# Patient Record
Sex: Female | Born: 1975 | Race: White | Hispanic: No | Marital: Married | Smoking: Former smoker
Health system: Southern US, Community
[De-identification: ages and names within clinical notes are randomized; demographics above are authoritative.]

## PROBLEM LIST (undated history)

## (undated) DIAGNOSIS — E119 Type 2 diabetes mellitus without complications: Secondary | ICD-10-CM

## (undated) DIAGNOSIS — I10 Essential (primary) hypertension: Secondary | ICD-10-CM

## (undated) HISTORY — PX: TUBAL LIGATION: SHX77

## (undated) HISTORY — DX: Type 2 diabetes mellitus without complications: E11.9

## (undated) HISTORY — DX: Essential (primary) hypertension: I10

---

## 2005-03-03 ENCOUNTER — Ambulatory Visit: Payer: Self-pay

## 2006-05-31 ENCOUNTER — Emergency Department: Payer: Self-pay | Admitting: Emergency Medicine

## 2007-08-05 ENCOUNTER — Emergency Department: Payer: Self-pay | Admitting: Emergency Medicine

## 2010-03-19 ENCOUNTER — Emergency Department: Payer: Self-pay

## 2010-08-29 ENCOUNTER — Emergency Department: Payer: Self-pay | Admitting: Unknown Physician Specialty

## 2010-12-07 ENCOUNTER — Ambulatory Visit: Payer: Self-pay | Admitting: Internal Medicine

## 2011-02-08 ENCOUNTER — Ambulatory Visit: Payer: Self-pay | Admitting: Surgery

## 2011-02-10 LAB — PATHOLOGY REPORT

## 2012-03-21 ENCOUNTER — Ambulatory Visit: Payer: Self-pay

## 2012-10-26 ENCOUNTER — Ambulatory Visit: Payer: Self-pay | Admitting: Internal Medicine

## 2012-12-27 ENCOUNTER — Other Ambulatory Visit: Payer: Self-pay | Admitting: Family

## 2013-01-18 ENCOUNTER — Ambulatory Visit: Payer: Self-pay

## 2013-01-23 ENCOUNTER — Ambulatory Visit: Payer: Self-pay

## 2013-02-26 ENCOUNTER — Ambulatory Visit: Payer: Self-pay

## 2013-09-06 ENCOUNTER — Emergency Department: Payer: Self-pay | Admitting: Emergency Medicine

## 2013-09-06 LAB — CBC WITH DIFFERENTIAL/PLATELET
Basophil #: 0.1 10*3/uL (ref 0.0–0.1)
Basophil %: 0.5 %
Eosinophil #: 0.2 10*3/uL (ref 0.0–0.7)
Eosinophil %: 1.5 %
HCT: 42.7 % (ref 35.0–47.0)
HGB: 14.5 g/dL (ref 12.0–16.0)
Lymphocyte #: 2.9 10*3/uL (ref 1.0–3.6)
MCH: 29.5 pg (ref 26.0–34.0)
MCV: 87 fL (ref 80–100)
Monocyte #: 0.8 x10 3/mm (ref 0.2–0.9)
Monocyte %: 5.6 %
Neutrophil #: 9.8 10*3/uL — ABNORMAL HIGH (ref 1.4–6.5)
Neutrophil %: 71.5 %
RBC: 4.94 10*6/uL (ref 3.80–5.20)
RDW: 12.9 % (ref 11.5–14.5)

## 2013-09-06 LAB — BASIC METABOLIC PANEL
BUN: 10 mg/dL (ref 7–18)
Calcium, Total: 9.1 mg/dL (ref 8.5–10.1)
Creatinine: 0.75 mg/dL (ref 0.60–1.30)
EGFR (Non-African Amer.): >60
Glucose: 261 mg/dL — ABNORMAL HIGH (ref 65–99)
Osmolality: 274 (ref 275–301)
Potassium: 3.3 mmol/L — ABNORMAL LOW (ref 3.5–5.1)
Sodium: 133 mmol/L — ABNORMAL LOW (ref 136–145)

## 2013-09-06 LAB — DRUG SCREEN, URINE

## 2013-09-06 LAB — URINALYSIS, COMPLETE
Bilirubin,UR: NEGATIVE
Blood: NEGATIVE
Glucose,UR: >=500 mg/dL (ref 0–75)
Leukocyte Esterase: NEGATIVE
Nitrite: NEGATIVE
Specific Gravity: 1.011 (ref 1.003–1.030)

## 2014-03-20 ENCOUNTER — Emergency Department: Payer: Self-pay | Admitting: Emergency Medicine

## 2014-03-20 LAB — HEMOGLOBIN: HGB: 13 g/dL (ref 12.0–16.0)

## 2014-03-21 ENCOUNTER — Emergency Department: Payer: Self-pay | Admitting: Cytopathology

## 2015-06-16 ENCOUNTER — Other Ambulatory Visit
Admission: RE | Admit: 2015-06-16 | Discharge: 2015-06-16 | Disposition: A | Discharge status: Home / Self Care | Payer: Self-pay | Source: Ambulatory Visit | Attending: Nurse Practitioner | Admitting: Nurse Practitioner

## 2015-06-16 DIAGNOSIS — E119 Type 2 diabetes mellitus without complications: Secondary | ICD-10-CM | POA: Insufficient documentation

## 2015-06-16 DIAGNOSIS — I10 Essential (primary) hypertension: Secondary | ICD-10-CM | POA: Insufficient documentation

## 2015-06-16 LAB — COMPREHENSIVE METABOLIC PANEL
ALT: 23 U/L (ref 14–54)
AST: 20 U/L (ref 15–41)
Albumin: 4.3 g/dL (ref 3.5–5.0)
Alkaline Phosphatase: 55 U/L (ref 38–126)
Anion gap: 7 (ref 5–15)
BUN: 10 mg/dL (ref 6–20)
CHLORIDE: 105 mmol/L (ref 101–111)
CO2: 25 mmol/L (ref 22–32)
CREATININE: 0.53 mg/dL (ref 0.44–1.00)
Calcium: 8.8 mg/dL — ABNORMAL LOW (ref 8.9–10.3)
GFR calc non Af Amer: >60 mL/min (ref >60)
Glucose, Bld: 142 mg/dL — ABNORMAL HIGH (ref 65–99)
POTASSIUM: 3.7 mmol/L (ref 3.5–5.1)
SODIUM: 137 mmol/L (ref 135–145)
Total Bilirubin: 0.1 mg/dL — ABNORMAL LOW (ref 0.3–1.2)
Total Protein: 7.2 g/dL (ref 6.5–8.1)

## 2015-06-16 LAB — T4, FREE: Free T4: 0.72 ng/dL (ref 0.61–1.12)

## 2015-06-16 LAB — LIPID PANEL
CHOL/HDL RATIO: 6 ratio
Cholesterol: 198 mg/dL (ref 0–200)
HDL: 33 mg/dL — AB (ref >40)
LDL CALC: 86 mg/dL (ref 0–99)
TRIGLYCERIDES: 396 mg/dL — AB (ref <150)
VLDL: 79 mg/dL — AB (ref 0–40)

## 2015-06-16 LAB — TSH: TSH: 0.967 u[IU]/mL (ref 0.350–4.500)

## 2017-11-24 ENCOUNTER — Ambulatory Visit: Payer: Self-pay | Admitting: Nurse Practitioner

## 2017-12-22 NOTE — Progress Notes (Signed)
Faxed patient assitance reorder form to [employer] for [employer] [phone]/[place]

## 2017-12-23 ENCOUNTER — Ambulatory Visit: Payer: Self-pay | Admitting: Nurse Practitioner

## 2017-12-23 ENCOUNTER — Other Ambulatory Visit: Payer: Self-pay

## 2017-12-23 MED ORDER — GLIMEPIRIDE 4 MG PO TABS: 4.0000 mg | ORAL_TABLET | Freq: Two times a day (BID) | ORAL | 5 refills | Status: DC

## 2017-12-28 ENCOUNTER — Encounter: Payer: Self-pay | Admitting: Internal Medicine

## 2017-12-28 ENCOUNTER — Ambulatory Visit: Payer: Self-pay | Admitting: Internal Medicine

## 2017-12-28 VITALS — BP 172/96 | HR 79 | Resp 16 | Ht 67.0 in | Wt 238.8 lb

## 2017-12-28 DIAGNOSIS — E119 Type 2 diabetes mellitus without complications: Secondary | ICD-10-CM | POA: Insufficient documentation

## 2017-12-28 DIAGNOSIS — E1165 Type 2 diabetes mellitus with hyperglycemia: Secondary | ICD-10-CM

## 2017-12-28 DIAGNOSIS — I10 Essential (primary) hypertension: Secondary | ICD-10-CM

## 2017-12-28 DIAGNOSIS — Z9119 Patient's noncompliance with other medical treatment and regimen: Secondary | ICD-10-CM

## 2017-12-28 DIAGNOSIS — Z91199 Patient's noncompliance with other medical treatment and regimen due to unspecified reason: Secondary | ICD-10-CM

## 2017-12-28 LAB — POCT GLYCOSYLATED HEMOGLOBIN (HGB A1C): HEMOGLOBIN A1C: 9.1

## 2017-12-28 MED ORDER — LISINOPRIL 20 MG PO TABS: ORAL_TABLET | ORAL | 3 refills | Status: DC

## 2017-12-28 NOTE — Progress Notes (Signed)
[facility] [address] [place], [place] [address]  Internal MEDICINE  Office Visit Note  Patient Name: Vicki Simmons  [id number]  [id number]  Date of Service: 12/28/2017  Chief Complaint  Patient presents with  . Diabetes    Diabetes  She has type 2 diabetes mellitus. Her disease course has been worsening. Pertinent negatives for hypoglycemia include no nervousness/anxiousness or tremors. Associated symptoms include polydipsia and visual change. Pertinent negatives for diabetes include no chest pain and no fatigue. There are no hypoglycemic complications. Current diabetic treatment includes insulin injections. Her home blood glucose trend is increasing rapidly.      Current Medication: Outpatient Encounter Medications as of 12/28/2017  Medication Sig  . ALPRAZolam (XANAX) 0.5 MG tablet Take by mouth.  Marland Kitchen glimepiride (AMARYL) 4 MG tablet Take 1 tablet (4 mg total) by mouth 2 (two) times daily.  . Insulin Glargine (TOUJEO SOLOSTAR) 300 UNIT/ML SOPN Inject 50 Units into the skin 2 (two) times daily.  Marland Kitchen liraglutide (VICTOZA) 18 MG/3ML SOPN Inject into the skin.  Marland Kitchen lisinopril (PRINIVIL,ZESTRIL) 20 MG tablet Take one and half tab a day  . [DISCONTINUED] lisinopril (PRINIVIL,ZESTRIL) 20 MG tablet Take by mouth.   No facility-administered encounter medications on file as of 12/28/2017.     Surgical History: Past Surgical History:  Procedure Laterality Date  . CESAREAN SECTION      Medical History: Past Medical History:  Diagnosis Date  . Diabetes (HCC)   . Hypertension     Family History: Family History  Problem Relation Age of Onset  . Diabetes Mother   . Hyperlipidemia Mother   . Hypertension Mother   . Heart attack Father   . Stroke Maternal Grandmother     Social History   Socioeconomic History  . Marital status: Married    Spouse name: Not on file  . Number of children: Not on file  . Years of education: Not on file  . Highest education level:  Not on file  Social Needs  . Financial resource strain: Not on file  . Food insecurity - worry: Not on file  . Food insecurity - inability: Not on file  . Transportation needs - medical: Not on file  . Transportation needs - non-medical: Not on file  Occupational History  . Not on file  Tobacco Use  . Smoking status: Current Every Day Smoker  . Smokeless tobacco: Never Used  Substance and Sexual Activity  . Alcohol use: No    Frequency: Never  . Drug use: No  . Sexual activity: Not on file  Other Topics Concern  . Not on file  Social History Narrative  . Not on file      Review of Systems  Constitutional: Negative for chills, fatigue and unexpected weight change.  HENT: Positive for postnasal drip. Negative for congestion, rhinorrhea, sneezing and sore throat.   Eyes: Negative for redness.  Respiratory: Negative for cough, chest tightness and shortness of breath.   Cardiovascular: Negative for chest pain and palpitations.  Gastrointestinal: Negative for abdominal pain, constipation, diarrhea, nausea and vomiting.  Endocrine: Positive for polydipsia.  Genitourinary: Negative for dysuria and frequency.  Musculoskeletal: Negative for arthralgias, back pain, joint swelling and neck pain.  Skin: Negative for rash.  Neurological: Negative.  Negative for tremors and numbness.  Hematological: Negative for adenopathy. Does not bruise/bleed easily.  Psychiatric/Behavioral: Negative for behavioral problems (Depression), sleep disturbance and suicidal ideas. The patient is not nervous/anxious.     Vital Signs: BP Marland Kitchen)  172/96 (BP Location: Left Arm, Patient Position: Sitting)   Pulse 79   Resp 16   Ht 5\' 7"  (1.702 m)   Wt 238 lb 12.8 oz (108.3 kg)   SpO2 96%   BMI 37.40 kg/m    Physical Exam  Constitutional: She appears well-developed and well-nourished.  Cardiovascular: Normal rate and regular rhythm.  Neurological: She is alert.    Assessment/Plan: 1. Uncontrolled type  2 diabetes mellitus with hyperglycemia (HCC) - POCT HgB A1C - Ambulatory referral to Endocrinology  2. Essential hypertension, benign - Increase  lisinopril (PRINIVIL,ZESTRIL) 20 MG tablet; Take one and half tab a day  Dispense: 135 tablet; Refill: 3  3. Morbid obesity (HCC) Obesity Counseling: Risk Assessment: An assessment of behavioral risk factors was made today and includes lack of exercise sedentary lifestyle, lack of portion control and poor dietary habits.  Risk Modification Advice: She was counseled on portion control guidelines. Restricting daily caloric intake to. . The detrimental long term effects of obesity on her health and ongoing poor compliance was also discussed with the patient.    4. Non compliance with medical treatment - Encourage compliance   General Counseling: Vicki Simmons verbalizes understanding of the findings of todays visit and agrees with plan of treatment. I have discussed any further diagnostic evaluation that may be needed or ordered today. We also reviewed her medications today. she has been encouraged to call the office with any questions or concerns that should arise related to todays visit.   Orders Placed This Encounter  Procedures  . Ambulatory referral to Endocrinology  . POCT HgB A1C    Meds ordered this encounter  Medications  . lisinopril (PRINIVIL,ZESTRIL) 20 MG tablet    Sig: Take one and half tab a day    Dispense:  135 tablet    Refill:  3    Time spent: 2125 Minutes  Dr Lyndon CodeFozia M Khan Internal medicine

## 2017-12-29 ENCOUNTER — Telehealth: Payer: Self-pay

## 2017-12-29 NOTE — Telephone Encounter (Signed)
Called patient to advise her that her medication from patient assistance has arrived. Vicki Simmons

## 2017-12-30 ENCOUNTER — Telehealth: Payer: Self-pay

## 2017-12-30 NOTE — Telephone Encounter (Signed)
Per Dr Welton FlakesKhan patient needs to continue care for diabetic with endocrinology and not pcp, patient has been referred to outside provider for uncontrolled dm, we will see patient every 6 months only for routine meds./ BR

## 2018-01-20 NOTE — Progress Notes (Signed)
Faxed patient assitance reorder form to [employer] for [employer] [phone].Titania

## 2018-01-24 ENCOUNTER — Encounter: Payer: Self-pay | Admitting: Emergency Medicine

## 2018-01-24 ENCOUNTER — Other Ambulatory Visit: Payer: Self-pay

## 2018-01-24 ENCOUNTER — Emergency Department
Admission: EM | Admit: 2018-01-24 | Discharge: 2018-01-24 | Disposition: A | Discharge status: Home / Self Care | Payer: Self-pay | Attending: Emergency Medicine | Admitting: Emergency Medicine

## 2018-01-24 ENCOUNTER — Emergency Department: Payer: Self-pay

## 2018-01-24 DIAGNOSIS — E119 Type 2 diabetes mellitus without complications: Secondary | ICD-10-CM | POA: Insufficient documentation

## 2018-01-24 DIAGNOSIS — Z79899 Other long term (current) drug therapy: Secondary | ICD-10-CM | POA: Insufficient documentation

## 2018-01-24 DIAGNOSIS — F17200 Nicotine dependence, unspecified, uncomplicated: Secondary | ICD-10-CM | POA: Insufficient documentation

## 2018-01-24 DIAGNOSIS — I10 Essential (primary) hypertension: Secondary | ICD-10-CM | POA: Insufficient documentation

## 2018-01-24 DIAGNOSIS — M25551 Pain in right hip: Secondary | ICD-10-CM | POA: Insufficient documentation

## 2018-01-24 MED ORDER — NAPROXEN 500 MG PO TABS: 500.0000 mg | ORAL_TABLET | Freq: Two times a day (BID) | ORAL | 0 refills | Status: DC

## 2018-01-24 MED ORDER — NAPROXEN 500 MG PO TABS
500.0000 mg | ORAL_TABLET | Freq: Once | ORAL | Status: AC
  Administered 2018-01-24: 500 mg via ORAL
  Filled 2018-01-24: qty 1

## 2018-01-24 MED ORDER — TRAMADOL HCL 50 MG PO TABS: 50.0000 mg | ORAL_TABLET | Freq: Two times a day (BID) | ORAL | 0 refills | Status: DC | PRN

## 2018-01-24 MED ORDER — TRAMADOL HCL 50 MG PO TABS
50.0000 mg | ORAL_TABLET | Freq: Once | ORAL | Status: AC
  Administered 2018-01-24: 50 mg via ORAL
  Filled 2018-01-24: qty 1

## 2018-01-24 NOTE — ED Provider Notes (Signed)
[facility] Emergency Department Provider Note   ____________________________________________   First MD Initiated Contact with Patient 01/24/18 [phone]     (approximate)  I have reviewed the triage vital signs and the nursing notes.   HISTORY  Chief Complaint Hip Pain    HPI Vicki Simmons is a 42 y.o. female patient complain of left hip pain secondary to fall this morning.  Patient that she missed a step and landed on her right hip.  Patient states able to ambulate with discomfort.  Patient rates pain as 8/10.  No palliative measure  prior to arrival.  Past Medical History:  Diagnosis Date  . Diabetes (HCC)   . Hypertension     Patient Active Problem List   Diagnosis Date Noted  . Diabetes (HCC) 12/28/2017  . Hypertension 12/28/2017    Past Surgical History:  Procedure Laterality Date  . CESAREAN SECTION    . TUBAL LIGATION      Prior to Admission medications   Medication Sig Start Date End Date Taking? Authorizing Provider  pioglitazone (ACTOS) 15 MG tablet Take 15 mg by mouth daily.   Yes [provider]  ALPRAZolam Prudy Feeler) 0.5 MG tablet Take by mouth.    [provider]  glimepiride (AMARYL) 4 MG tablet Take 1 tablet (4 mg total) by mouth 2 (two) times daily. 12/23/17   Carlean Jews, NP  Insulin Glargine (TOUJEO SOLOSTAR) 300 UNIT/ML SOPN Inject 50 Units into the skin 2 (two) times daily.    [provider]  liraglutide (VICTOZA) 18 MG/3ML SOPN Inject into the skin.    [provider]  lisinopril (PRINIVIL,ZESTRIL) 20 MG tablet Take one and half tab a day 12/28/17   Lyndon Code, MD  naproxen (NAPROSYN) 500 MG tablet Take 1 tablet (500 mg total) by mouth 2 (two) times daily with a meal. 01/24/18   Joni Reining, PA-C  traMADol (ULTRAM) 50 MG tablet Take 1 tablet (50 mg total) by mouth every 12 (twelve) hours as needed. 01/24/18   Joni Reining, PA-C    Allergies Patient has no known  allergies.  Family History  Problem Relation Age of Onset  . Diabetes Mother   . Hyperlipidemia Mother   . Hypertension Mother   . Heart attack Father   . Stroke Maternal Grandmother     Social History Social History   Tobacco Use  . Smoking status: Current Every Day Smoker  . Smokeless tobacco: Never Used  Substance Use Topics  . Alcohol use: No    Frequency: Never  . Drug use: No    Review of Systems Constitutional: No fever/chills Eyes: No visual changes. ENT: No sore throat. Cardiovascular: Denies chest pain. Respiratory: Denies shortness of breath. Gastrointestinal: No abdominal pain.  No nausea, no vomiting.  No diarrhea.  No constipation. Genitourinary: Negative for dysuria. Musculoskeletal right lateral hip pain Skin: Negative for rash. Neurological: Negative for headaches, focal weakness or numbness.   ____________________________________________   PHYSICAL EXAM:  VITAL SIGNS: ED Triage Vitals  Enc Vitals Group     BP 01/24/18 0941 (!) 145/72     Pulse Rate 01/24/18 0941 82     Resp 01/24/18 0941 16     Temp 01/24/18 0941 98 F (36.7 C)     Temp Source 01/24/18 0941 Oral     SpO2 01/24/18 0941 100 %     Weight 01/24/18 0938 237 lb (107.5 kg)     Height 01/24/18 0938 5\' 7"  (1.702  m)     Head Circumference --      Peak Flow --      Pain Score 01/24/18 0938 8     Pain Loc --      Pain Edu? --      Excl. in GC? --     Constitutional: Alert and oriented. Well appearing and in no acute distress. Hematological/Lymphatic/Immunilogical: No cervical lymphadenopathy. Cardiovascular: Normal rate, regular rhythm. Grossly normal heart sounds.  Good peripheral circulation. Respiratory: Normal respiratory effort.  No retractions. Lungs CTAB. Gastrointestinal: Soft and nontender. No distention. No abdominal bruits. No CVA tenderness. Musculoskeletal: No obvious deformity to the right hip.  Patient has moderate guarding palpation to greater trochanter area.   Patient ambulates with atypical gait. Neurologic:  Normal speech and language. No gross focal neurologic deficits are appreciated. No gait instability. Skin:  Skin is warm, dry and intact. No rash noted. Psychiatric: Mood and affect are normal. Speech and behavior are normal.  ____________________________________________   LABS (all labs ordered are listed, but only abnormal results are displayed)  Labs Reviewed  POC URINE PREG, ED   ____________________________________________  EKG   ____________________________________________  RADIOLOGY  No acute findings on x-ray of the right hip.  Official radiology report(s): Dg Hip Unilat W Or Wo Pelvis 2-3 Views Right  Result Date: 01/24/2018 CLINICAL DATA:  42 year old female with right hip pain after falling down the steps yesterday EXAM: DG HIP (WITH OR WITHOUT PELVIS) 2-3V RIGHT COMPARISON:  None. FINDINGS: There is no evidence of hip fracture or dislocation. There is no evidence of arthropathy or other focal bone abnormality. IMPRESSION: Negative. Electronically Signed   By: Malachy MoanHeath  McCullough M.D.   On: 01/24/2018 10:49    ____________________________________________   PROCEDURES  Procedure(s) performed:   Procedures  Critical Care performed:   ____________________________________________   INITIAL IMPRESSION / ASSESSMENT AND PLAN / ED COURSE  As part of my medical decision making, I reviewed the following data within the electronic MEDICAL RECORD NUMBER    Patient presents with left hip pain secondary to fall.  Differential diagnosis consisting of hip fracture/pelvic fracture.  Discussed negative x-ray findings with patient.  Patient given discharge care instruction advised take medication as directed.  Patient given a work note.  Patient advised [facility].      ____________________________________________   FINAL CLINICAL IMPRESSION(S) / ED DIAGNOSES  Final  diagnoses:  Right hip pain     ED Discharge Orders        Ordered    naproxen (NAPROSYN) 500 MG tablet  2 times daily with meals     01/24/18 1112    traMADol (ULTRAM) 50 MG tablet  Every 12 hours PRN     01/24/18 1113       Note:  This document was prepared using Dragon voice recognition software and may include unintentional dictation errors.    Joni ReiningSmith, Elizah Mierzwa K, PA-C 01/24/18 1118    Jene EveryKinner, Robert, MD 01/24/18 1150

## 2018-01-24 NOTE — ED Notes (Signed)
See triage note  States she fell down steps  Landed on right side   Having pain to right hip area  No deformity noted  Positive swelling  Good pulses

## 2018-01-24 NOTE — ED Triage Notes (Signed)
Missed the step this am, landed on right hip, limp to triage.

## 2018-01-26 ENCOUNTER — Telehealth: Payer: Self-pay

## 2018-01-26 NOTE — Telephone Encounter (Signed)
Patient has been advised that her patient assitance med is ready for pickup. [place]

## 2018-01-29 DIAGNOSIS — F17200 Nicotine dependence, unspecified, uncomplicated: Secondary | ICD-10-CM | POA: Insufficient documentation

## 2018-02-07 ENCOUNTER — Telehealth: Payer: Self-pay

## 2018-02-07 NOTE — Telephone Encounter (Signed)
Patient is not able to refill her medication until 02/22/18 through patient assisance for [employer] .Tat

## 2018-03-01 ENCOUNTER — Telehealth: Payer: Self-pay

## 2018-03-02 ENCOUNTER — Telehealth: Payer: Self-pay

## 2018-03-02 NOTE — Telephone Encounter (Signed)
Pt xanax rx was denied and faxed to pharmacy.

## 2018-03-03 NOTE — Telephone Encounter (Signed)
Denied for xanax as per dr Welton Flakes

## 2018-03-23 ENCOUNTER — Telehealth: Payer: Self-pay

## 2018-03-23 NOTE — Telephone Encounter (Signed)
As per dfk pt said goes to see dr in [place] for xanax pres refills

## 2018-04-10 DIAGNOSIS — E1165 Type 2 diabetes mellitus with hyperglycemia: Secondary | ICD-10-CM | POA: Insufficient documentation

## 2018-04-10 DIAGNOSIS — Z794 Long term (current) use of insulin: Secondary | ICD-10-CM | POA: Insufficient documentation

## 2018-06-07 DIAGNOSIS — I152 Hypertension secondary to endocrine disorders: Secondary | ICD-10-CM | POA: Insufficient documentation

## 2018-06-07 DIAGNOSIS — E1159 Type 2 diabetes mellitus with other circulatory complications: Secondary | ICD-10-CM | POA: Insufficient documentation

## 2018-08-20 IMAGING — CR DG HIP (WITH OR WITHOUT PELVIS) 2-3V*R*
3 series · 3 of 3 positions shown · non-contrast
Comparison: None.

CLINICAL DATA: 41-year-old female with right hip pain after falling
down the steps yesterday

EXAM:
DG HIP (WITH OR WITHOUT PELVIS) 2-3V RIGHT

[pelvis ap]
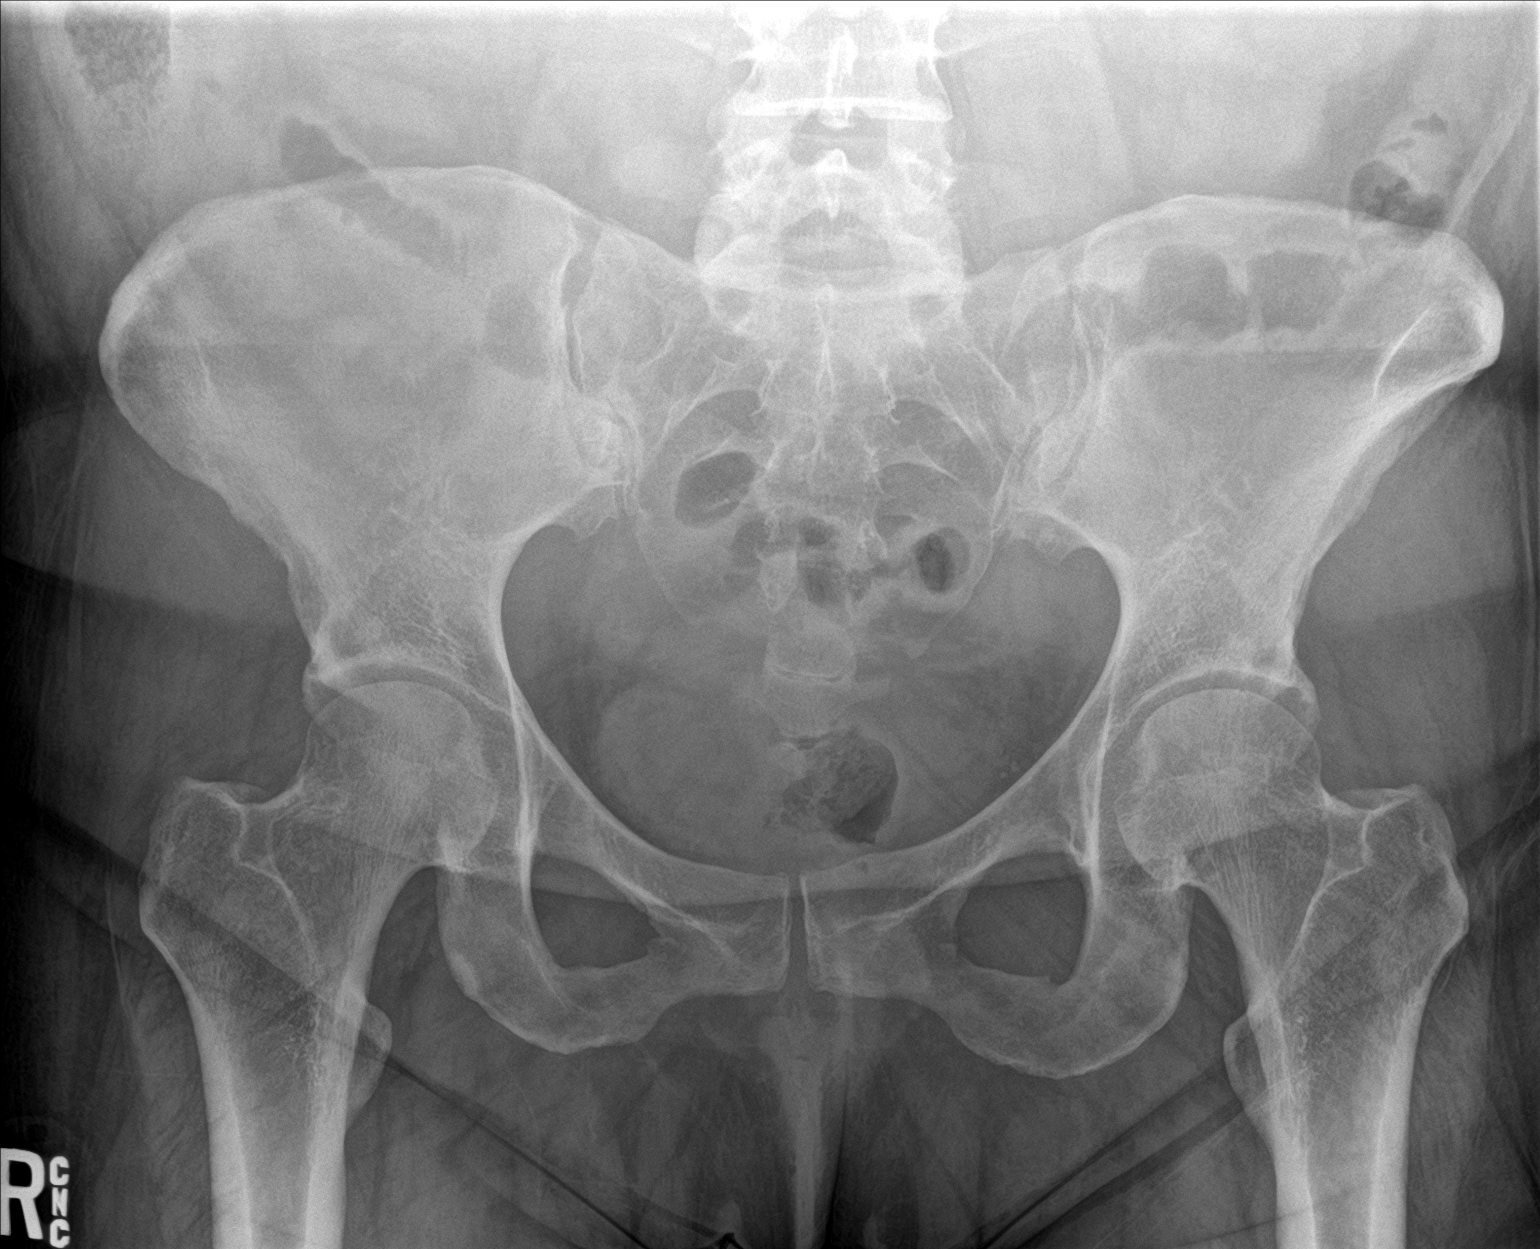

[hip ap]
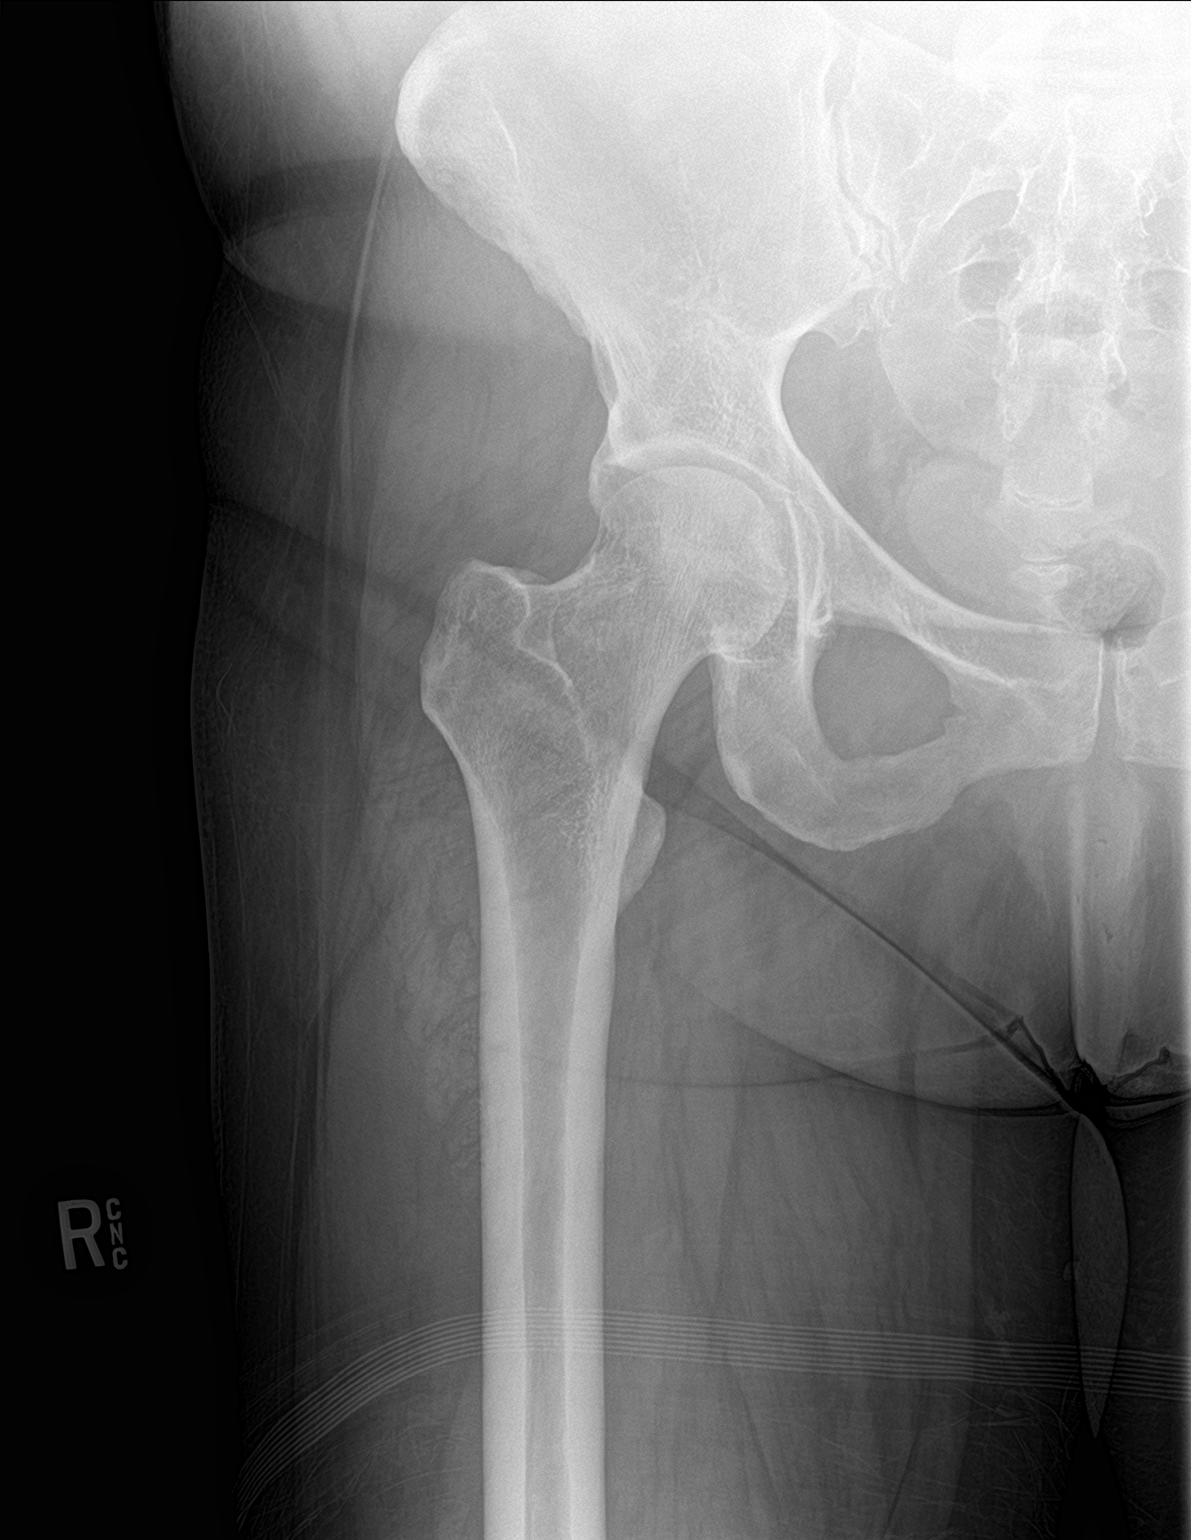

[hip lat]
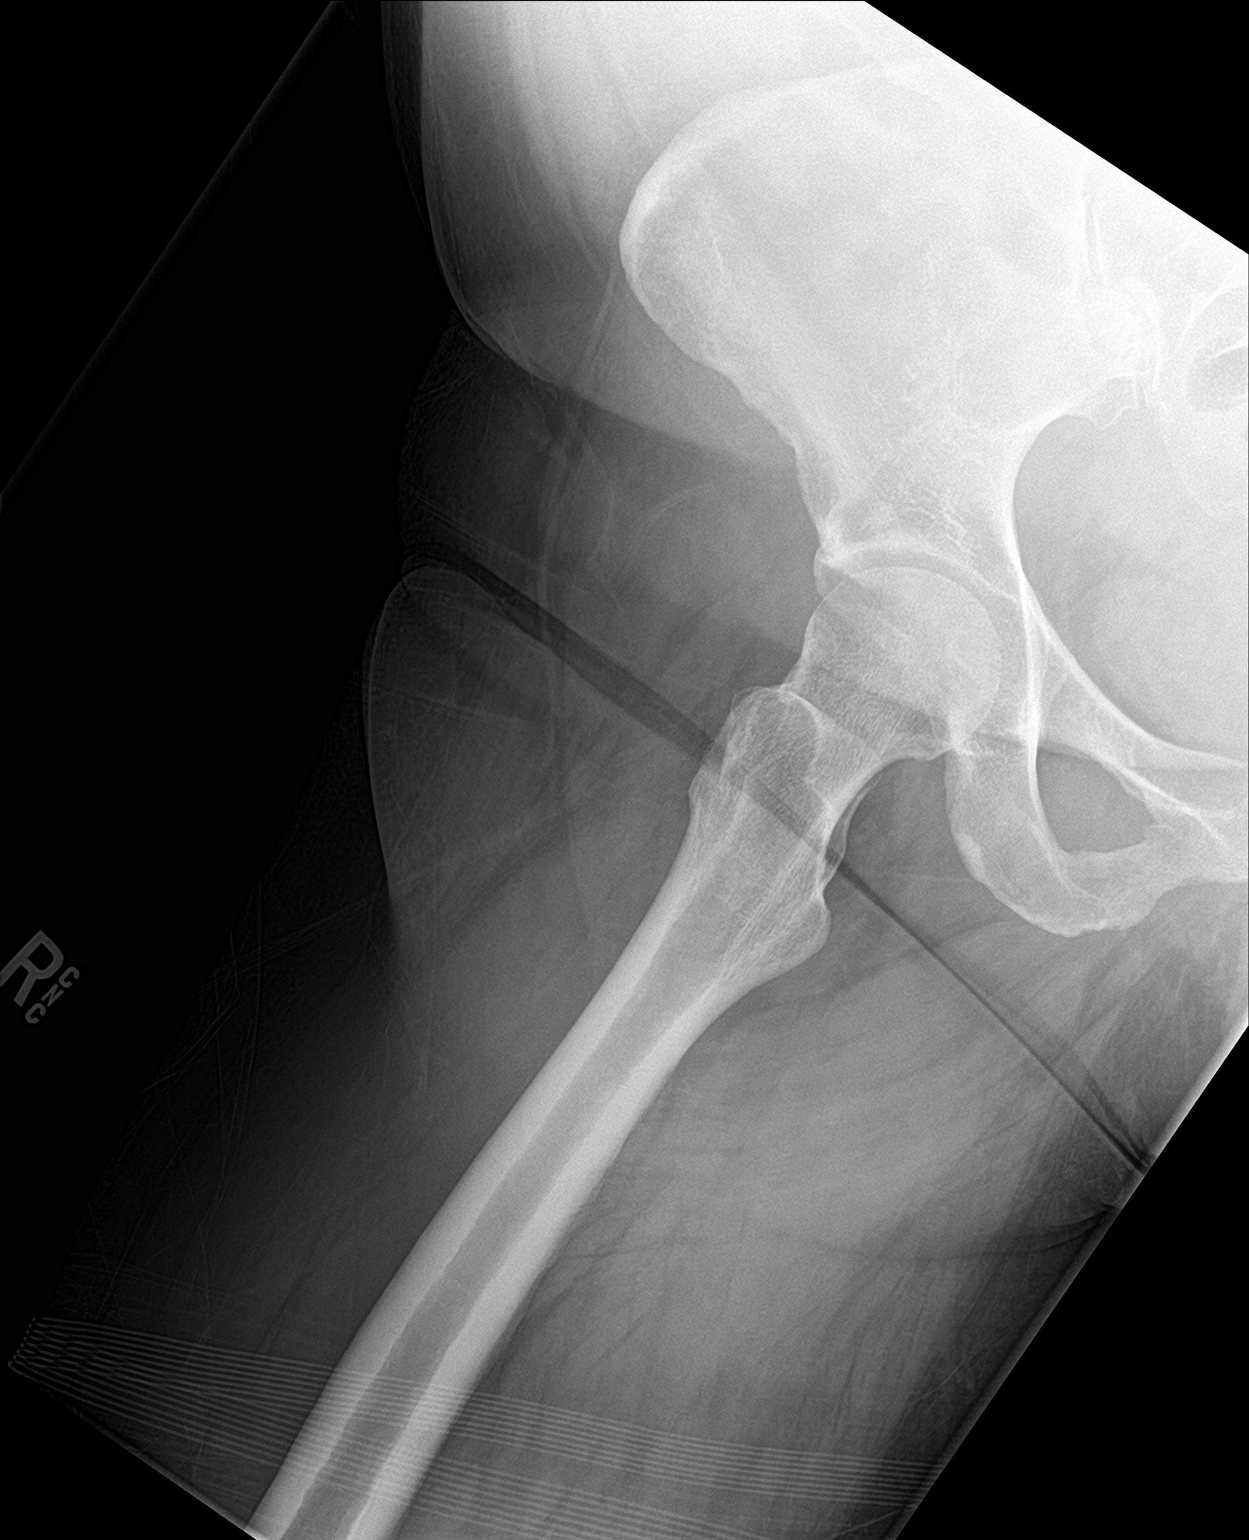

[3 of 3 positions shown; findings below may reference images not displayed]

FINDINGS: There is no evidence of hip fracture or dislocation. There is no
evidence of arthropathy or other focal bone abnormality.
IMPRESSION: Negative.

## 2018-08-28 ENCOUNTER — Ambulatory Visit (INDEPENDENT_AMBULATORY_CARE_PROVIDER_SITE_OTHER): Payer: Medicaid Other | Admitting: Adult Health

## 2018-08-28 ENCOUNTER — Encounter: Payer: Self-pay | Admitting: Adult Health

## 2018-08-28 VITALS — BP 142/86 | HR 90 | Temp 98.1°F | Resp 16 | Ht 67.0 in | Wt 246.0 lb

## 2018-08-28 DIAGNOSIS — S29012A Strain of muscle and tendon of back wall of thorax, initial encounter: Secondary | ICD-10-CM

## 2018-08-28 DIAGNOSIS — E1165 Type 2 diabetes mellitus with hyperglycemia: Secondary | ICD-10-CM

## 2018-08-28 DIAGNOSIS — R300 Dysuria: Secondary | ICD-10-CM

## 2018-08-28 DIAGNOSIS — I10 Essential (primary) hypertension: Secondary | ICD-10-CM

## 2018-08-28 LAB — POCT URINALYSIS DIPSTICK
BILIRUBIN UA: NEGATIVE
Glucose, UA: POSITIVE — AB
Ketones, UA: NEGATIVE
Leukocytes, UA: NEGATIVE
Nitrite, UA: NEGATIVE
Protein, UA: POSITIVE — AB
RBC UA: NEGATIVE
Spec Grav, UA: 1.01 (ref 1.010–1.025)
Urobilinogen, UA: 0.2 E.U./dL
pH, UA: 5 (ref 5.0–8.0)

## 2018-08-28 MED ORDER — CYCLOBENZAPRINE HCL 10 MG PO TABS: 10.0000 mg | ORAL_TABLET | Freq: Every evening | ORAL | 0 refills | Status: DC | PRN

## 2018-08-28 NOTE — Progress Notes (Signed)
[facility] [address] [place], [place] [address]  Internal MEDICINE  Office Visit Note  Patient Name: Vicki Simmons  [id number]  [id number]  Date of Service: 08/28/2018  Chief Complaint  Patient presents with  . Back Pain    lower back pain and swelling started saturday     HPI Pt is here for a sick visit. Pt reports two days ago she woke up and noticed her left flank/side was hurting.  She waited it out and reports It has gotten increasingly worse over the last two days.  She describes the pain as sharp stabbing pain, and a burning pain.  She reports the pain hurts with sitting, and standing.  She can not seem to find a position of comfort. She reports swelling to this area.  She denies any injury, fall, or trauma.     Current Medication:  Outpatient Encounter Medications as of 08/28/2018  Medication Sig  . ALPRAZolam (XANAX) 0.5 MG tablet Take by mouth.  Marland Kitchen glimepiride (AMARYL) 4 MG tablet Take 1 tablet (4 mg total) by mouth 2 (two) times daily.  . insulin NPH Human (HUMULIN N,NOVOLIN N) 100 UNIT/ML injection Administer 55 units subcutaneously in am, 65 units in evening  . lisinopril (PRINIVIL,ZESTRIL) 20 MG tablet Take one and half tab a day  . metFORMIN (GLUCOPHAGE-XR) 500 MG 24 hr tablet Take 1,000 mg by mouth 2 (two) times daily with a meal.  . naproxen (NAPROSYN) 500 MG tablet Take 1 tablet (500 mg total) by mouth 2 (two) times daily with a meal.  . pioglitazone (ACTOS) 15 MG tablet Take 15 mg by mouth daily.  . traMADol (ULTRAM) 50 MG tablet Take 1 tablet (50 mg total) by mouth every 12 (twelve) hours as needed.  . cyclobenzaprine (FLEXERIL) 10 MG tablet Take 1 tablet (10 mg total) by mouth at bedtime as needed for muscle spasms.  . [DISCONTINUED] Insulin Glargine (TOUJEO SOLOSTAR) 300 UNIT/ML SOPN Inject 50 Units into the skin 2 (two) times daily.  . [DISCONTINUED] liraglutide (VICTOZA) 18 MG/3ML SOPN Inject into the skin.   No facility-administered  encounter medications on file as of 08/28/2018.       Medical History: Past Medical History:  Diagnosis Date  . Diabetes (HCC)   . Hypertension      Vital Signs: BP (!) 142/86 (BP Location: Left Arm, Patient Position: Sitting, Cuff Size: Normal)   Pulse 90   Temp 98.1 F (36.7 C) (Oral)   Resp 16   Ht 5\' 7"  (1.702 m)   Wt 246 lb (111.6 kg)   SpO2 99%   BMI 38.53 kg/m    Review of Systems  Constitutional: Negative for chills, fatigue and unexpected weight change.  HENT: Negative for congestion, rhinorrhea, sneezing and sore throat.   Eyes: Negative for photophobia, pain and redness.  Respiratory: Negative for cough, chest tightness and shortness of breath.   Cardiovascular: Negative for chest pain and palpitations.  Gastrointestinal: Negative for abdominal pain, constipation, diarrhea, nausea and vomiting.  Endocrine: Negative.   Genitourinary: Negative for dysuria and frequency.  Musculoskeletal: Positive for back pain. Negative for arthralgias, joint swelling and neck pain.       Left flank pain  Skin: Negative for rash.  Allergic/Immunologic: Negative.   Neurological: Negative for tremors and numbness.  Hematological: Negative for adenopathy. Does not bruise/bleed easily.  Psychiatric/Behavioral: Negative for behavioral problems and sleep disturbance. The patient is not nervous/anxious.     Physical Exam  Constitutional: She is oriented to  person, place, and time. She appears well-developed and well-nourished. No distress.  HENT:  Head: Normocephalic and atraumatic.  Mouth/Throat: Oropharynx is clear and moist. No oropharyngeal exudate.  Eyes: Pupils are equal, round, and reactive to light. EOM are normal.  Neck: Normal range of motion. Neck supple. No JVD present. No tracheal deviation present. No thyromegaly present.  Cardiovascular: Normal rate, regular rhythm and normal heart sounds. Exam reveals no gallop and no friction rub.  No murmur  heard. Pulmonary/Chest: Effort normal and breath sounds normal. No respiratory distress. She has no wheezes. She has no rales. She exhibits no tenderness.  Abdominal: Soft. There is no tenderness. There is no guarding.  Musculoskeletal: Normal range of motion.  Mild pain with palpation over left flank  Lymphadenopathy:    She has no cervical adenopathy.  Neurological: She is alert and oriented to person, place, and time. No cranial nerve deficit.  Skin: Skin is warm and dry. She is not diaphoretic.  Psychiatric: She has a normal mood and affect. Her behavior is normal. Judgment and thought content normal.  Nursing note and vitals reviewed.  Assessment/Plan: 1. Strain of muscle and tendon of back wall of thorax, initial encounter Patient's exam and history consistent for muscle strain.  Will try p.o. muscle relaxers to see if there is improvement.  Instructed patient to use heating pad and topical medications like icy hot.  If no improvement in the next week patient should call or return to clinic for further evaluation.  - cyclobenzaprine (FLEXERIL) 10 MG tablet; Take 1 tablet (10 mg total) by mouth at bedtime as needed for muscle spasms.  Dispense: 20 tablet; Refill: 0  2. Uncontrolled type 2 diabetes mellitus with hyperglycemia [facility]) Patient reports she sees endocrinology for her diabetes.  Her A1c is still slightly elevated, however they are working on this. 3. Essential hypertension, benign Patient blood pressure slightly elevated at this visit.  Most likely due to pain and discomfort.  We will continue to follow at future visits.  4. Morbid obesity (HCC) Obesity Counseling: Risk Assessment: An assessment of behavioral risk factors was made today and includes lack of exercise sedentary lifestyle, lack of portion control and poor dietary habits.  Risk Modification Advice: She was counseled on portion control guidelines. Restricting daily caloric intake to. . The detrimental long term  effects of obesity on her health and ongoing poor compliance was also discussed with the patient.  5. Dysuria  - POCT Urinalysis Dipstick  General Counseling: Raylin verbalizes understanding of the findings of todays visit and agrees with plan of treatment. I have discussed any further diagnostic evaluation that may be needed or ordered today. We also reviewed her medications today. she has been encouraged to call the office with any questions or concerns that should arise related to todays visit.   Orders Placed This Encounter  Procedures  . POCT Urinalysis Dipstick    Meds ordered this encounter  Medications  . cyclobenzaprine (FLEXERIL) 10 MG tablet    Sig: Take 1 tablet (10 mg total) by mouth at bedtime as needed for muscle spasms.    Dispense:  20 tablet    Refill:  0    Time spent: 25 Minutes  This patient was seen by Blima Ledger AGNP-C in Collaboration with Dr Lyndon Code as a part of collaborative care agreement.  Johnna Acosta AGNP-C Internal Medicine

## 2018-08-31 ENCOUNTER — Emergency Department: Payer: Self-pay

## 2018-08-31 ENCOUNTER — Emergency Department
Admission: EM | Admit: 2018-08-31 | Discharge: 2018-08-31 | Disposition: A | Discharge status: Home / Self Care | Payer: Self-pay | Attending: Emergency Medicine | Admitting: Emergency Medicine

## 2018-08-31 ENCOUNTER — Other Ambulatory Visit: Payer: Self-pay

## 2018-08-31 DIAGNOSIS — F17210 Nicotine dependence, cigarettes, uncomplicated: Secondary | ICD-10-CM | POA: Insufficient documentation

## 2018-08-31 DIAGNOSIS — R1084 Generalized abdominal pain: Secondary | ICD-10-CM | POA: Insufficient documentation

## 2018-08-31 DIAGNOSIS — M545 Low back pain, unspecified: Secondary | ICD-10-CM

## 2018-08-31 DIAGNOSIS — Z7984 Long term (current) use of oral hypoglycemic drugs: Secondary | ICD-10-CM | POA: Insufficient documentation

## 2018-08-31 DIAGNOSIS — Z79899 Other long term (current) drug therapy: Secondary | ICD-10-CM | POA: Insufficient documentation

## 2018-08-31 DIAGNOSIS — E119 Type 2 diabetes mellitus without complications: Secondary | ICD-10-CM | POA: Insufficient documentation

## 2018-08-31 DIAGNOSIS — I10 Essential (primary) hypertension: Secondary | ICD-10-CM | POA: Insufficient documentation

## 2018-08-31 LAB — URINALYSIS, COMPLETE (UACMP) WITH MICROSCOPIC
Bacteria, UA: NONE SEEN
Bilirubin Urine: NEGATIVE
Hgb urine dipstick: NEGATIVE
KETONES UR: NEGATIVE mg/dL
Leukocytes, UA: NEGATIVE
Nitrite: NEGATIVE
PROTEIN: NEGATIVE mg/dL
Specific Gravity, Urine: 1.016 (ref 1.005–1.030)
pH: 5 (ref 5.0–8.0)

## 2018-08-31 LAB — BASIC METABOLIC PANEL
ANION GAP: 11 (ref 5–15)
BUN: 13 mg/dL (ref 6–20)
CALCIUM: 9.1 mg/dL (ref 8.9–10.3)
CO2: 22 mmol/L (ref 22–32)
Chloride: 104 mmol/L (ref 98–111)
Creatinine, Ser: 0.64 mg/dL (ref 0.44–1.00)
GFR calc Af Amer: >60 mL/min (ref >60)
Glucose, Bld: 215 mg/dL — ABNORMAL HIGH (ref 70–99)
POTASSIUM: 3.7 mmol/L (ref 3.5–5.1)
Sodium: 137 mmol/L (ref 135–145)

## 2018-08-31 LAB — CBC
HCT: 40.2 % (ref 36.0–46.0)
HEMOGLOBIN: 13.4 g/dL (ref 12.0–15.0)
MCH: 30.1 pg (ref 26.0–34.0)
MCHC: 33.3 g/dL (ref 30.0–36.0)
MCV: 90.3 fL (ref 80.0–100.0)
PLATELETS: 286 10*3/uL (ref 150–400)
RBC: 4.45 MIL/uL (ref 3.87–5.11)
RDW: 12.9 % (ref 11.5–15.5)
WBC: 9.6 10*3/uL (ref 4.0–10.5)
nRBC: 0 % (ref 0.0–0.2)

## 2018-08-31 LAB — POCT PREGNANCY, URINE: PREG TEST UR: NEGATIVE

## 2018-08-31 LAB — PREGNANCY, URINE: PREG TEST UR: NEGATIVE

## 2018-08-31 MED ORDER — KETOROLAC TROMETHAMINE 30 MG/ML IJ SOLN
30.0000 mg | Freq: Once | INTRAMUSCULAR | Status: AC
  Administered 2018-08-31: 30 mg via INTRAMUSCULAR
  Filled 2018-08-31: qty 1

## 2018-08-31 MED ORDER — NAPROXEN 500 MG PO TABS: 500.0000 mg | ORAL_TABLET | Freq: Two times a day (BID) | ORAL | 2 refills | Status: DC

## 2018-08-31 NOTE — ED Provider Notes (Signed)
[facility] Emergency Department Provider Note   ____________________________________________    I have reviewed the triage vital signs and the nursing notes.   HISTORY  Chief Complaint Flank Pain     HPI Vicki Simmons is a 42 y.o. female who presents with complaints of left lower back/flank pain.  Patient reports she was seen by her PCP recently and started on muscle relaxers but she states this is not helped.  She reports the pain is worse with movement.  Denies dysuria.  No hematuria.  No fevers or chills.  No nausea or vomiting.  Pain occasionally seems to radiate to her left hip.  No weakness.  No incontinence.  No numbness or tingling.  No IV drug abuse  Past Medical History:  Diagnosis Date  . Diabetes (HCC)   . Hypertension     Patient Active Problem List   Diagnosis Date Noted  . Diabetes (HCC) 12/28/2017  . Hypertension 12/28/2017    Past Surgical History:  Procedure Laterality Date  . CESAREAN SECTION    . TUBAL LIGATION      Prior to Admission medications   Medication Sig Start Date End Date Taking? Authorizing Provider  ALPRAZolam Prudy Feeler) 0.5 MG tablet Take by mouth.    [provider]  cyclobenzaprine (FLEXERIL) 10 MG tablet Take 1 tablet (10 mg total) by mouth at bedtime as needed for muscle spasms. 08/28/18   Scarboro, Coralee North, NP  glimepiride (AMARYL) 4 MG tablet Take 1 tablet (4 mg total) by mouth 2 (two) times daily. 12/23/17   Carlean Jews, NP  insulin NPH Human (HUMULIN N,NOVOLIN N) 100 UNIT/ML injection Administer 55 units subcutaneously in am, 65 units in evening 02/21/18   [provider]  lisinopril (PRINIVIL,ZESTRIL) 20 MG tablet Take one and half tab a day 12/28/17   Lyndon Code, MD  metFORMIN (GLUCOPHAGE-XR) 500 MG 24 hr tablet Take 1,000 mg by mouth 2 (two) times daily with a meal. 05/29/18   [provider]  naproxen (NAPROSYN) 500 MG tablet Take 1 tablet (500 mg total) by mouth 2 (two)  times daily with a meal. 08/31/18   Jene Every, MD  pioglitazone (ACTOS) 15 MG tablet Take 15 mg by mouth daily.    [provider]  traMADol (ULTRAM) 50 MG tablet Take 1 tablet (50 mg total) by mouth every 12 (twelve) hours as needed. 01/24/18   Joni Reining, PA-C     Allergies Patient has no known allergies.  Family History  Problem Relation Age of Onset  . Diabetes Mother   . Hyperlipidemia Mother   . Hypertension Mother   . Heart attack Father   . Stroke Maternal Grandmother     Social History Social History   Tobacco Use  . Smoking status: Current Every Day Smoker  . Smokeless tobacco: Never Used  Substance Use Topics  . Alcohol use: No    Frequency: Never  . Drug use: No    Review of Systems  Constitutional: No fever/chills Eyes: No visual changes.  ENT: No neck pain Cardiovascular: Denies chest pain. Respiratory: Denies shortness of breath. Gastrointestinal: As above Genitourinary: As above Musculoskeletal: As above Skin: Negative for rash. Neurological: Negative for headaches or weakness   ____________________________________________   PHYSICAL EXAM:  VITAL SIGNS: ED Triage Vitals  Enc Vitals Group     BP 08/31/18 1252 (!) 178/85     Pulse Rate 08/31/18 1252 96     Resp 08/31/18 1252 17  Temp 08/31/18 1252 97.9 F (36.6 C)     Temp Source 08/31/18 1252 Oral     SpO2 08/31/18 1252 99 %     Weight 08/31/18 1254 111.1 kg (245 lb)     Height 08/31/18 1254 1.702 m (5\' 7" )     Head Circumference --      Peak Flow --      Pain Score 08/31/18 1254 7     Pain Loc --      Pain Edu? --      Excl. in GC? --     Constitutional: Alert and oriented.  Neck:  Painless ROM Cardiovascular: Normal rate, regular rhythm. Grossly normal heart sounds.  Good peripheral circulation. Respiratory: Normal respiratory effort.  No retractions. Gastrointestinal: Soft and nontender. No distention.  No CVA tenderness.  Musculoskeletal: No lower  extremity tenderness nor edema.  Warm and well perfused.  Back: No vertebral tenderness palpation, left lower lumbar paraspinal muscle spasm, tender to palpation Neurologic:  Normal speech and language. No gross focal neurologic deficits are appreciated.  Skin:  Skin is warm, dry and intact. No rash noted. Psychiatric: Mood and affect are normal. Speech and behavior are normal.  ____________________________________________   LABS (all labs ordered are listed, but only abnormal results are displayed)  Labs Reviewed  URINALYSIS, COMPLETE (UACMP) WITH MICROSCOPIC - Abnormal; Notable for the following components:      Result Value   Color, Urine YELLOW (*)    APPearance CLEAR (*)    Glucose, UA >=500 (*)    All other components within normal limits  BASIC METABOLIC PANEL - Abnormal; Notable for the following components:   Glucose, Bld 215 (*)    All other components within normal limits  CBC  PREGNANCY, URINE  POCT PREGNANCY, URINE  POC URINE PREG, ED   ____________________________________________  EKG  None ____________________________________________  RADIOLOGY  CT renal stone study negative ____________________________________________   PROCEDURES  Procedure(s) performed: No  Procedures   Critical Care performed: No ____________________________________________   INITIAL IMPRESSION / ASSESSMENT AND PLAN / ED COURSE  Pertinent labs & imaging results that were available during my care of the patient were reviewed by me and considered in my medical decision making (see chart for details).  Appearing in no acute distress, complains of left back/flank pain with some radiation to the left hip.  Suspects muscle spasm, doubt kidney stone however she is quite concerned about this.  Will obtain imaging, treat with IM Toradol and reevaluate.  Urine is unremarkable  CT scan is normal.  Patient feels better after Toradol.  Will discharge with supportive care, outpatient  follow-up as needed    ____________________________________________   FINAL CLINICAL IMPRESSION(S) / ED DIAGNOSES  Final diagnoses:  Acute left-sided low back pain without sciatica        Note:  This document was prepared using Dragon voice recognition software and may include unintentional dictation errors.     Jene Every, MD 08/31/18 [phone]

## 2018-08-31 NOTE — ED Triage Notes (Signed)
Pt c/o left flank pain since Saturday, states she was seen by her PCP and had a neg urine. States the pain is worsening, states they gave her muscle relaxer's that didn't relieve. Denies N/V/D.

## 2018-11-17 ENCOUNTER — Emergency Department
Admission: EM | Admit: 2018-11-17 | Discharge: 2018-11-18 | Disposition: A | Discharge status: Home / Self Care | Payer: Medicaid Other | Attending: Emergency Medicine | Admitting: Emergency Medicine

## 2018-11-17 ENCOUNTER — Emergency Department: Payer: Medicaid Other

## 2018-11-17 DIAGNOSIS — Z79899 Other long term (current) drug therapy: Secondary | ICD-10-CM | POA: Insufficient documentation

## 2018-11-17 DIAGNOSIS — R51 Headache: Secondary | ICD-10-CM

## 2018-11-17 DIAGNOSIS — E119 Type 2 diabetes mellitus without complications: Secondary | ICD-10-CM | POA: Insufficient documentation

## 2018-11-17 DIAGNOSIS — R519 Headache, unspecified: Secondary | ICD-10-CM

## 2018-11-17 DIAGNOSIS — Z794 Long term (current) use of insulin: Secondary | ICD-10-CM | POA: Insufficient documentation

## 2018-11-17 DIAGNOSIS — F17210 Nicotine dependence, cigarettes, uncomplicated: Secondary | ICD-10-CM | POA: Insufficient documentation

## 2018-11-17 DIAGNOSIS — R030 Elevated blood-pressure reading, without diagnosis of hypertension: Secondary | ICD-10-CM

## 2018-11-17 DIAGNOSIS — I10 Essential (primary) hypertension: Secondary | ICD-10-CM | POA: Insufficient documentation

## 2018-11-17 LAB — POCT PREGNANCY, URINE: Preg Test, Ur: NEGATIVE

## 2018-11-17 LAB — BASIC METABOLIC PANEL
ANION GAP: 8 (ref 5–15)
BUN: 9 mg/dL (ref 6–20)
CO2: 25 mmol/L (ref 22–32)
CREATININE: 0.58 mg/dL (ref 0.44–1.00)
Calcium: 9.1 mg/dL (ref 8.9–10.3)
Chloride: 106 mmol/L (ref 98–111)
GFR calc Af Amer: >60 mL/min (ref >60)
GLUCOSE: 200 mg/dL — AB (ref 70–99)
Potassium: 3.2 mmol/L — ABNORMAL LOW (ref 3.5–5.1)
Sodium: 139 mmol/L (ref 135–145)

## 2018-11-17 LAB — CBC
HCT: 40.1 % (ref 36.0–46.0)
Hemoglobin: 13.4 g/dL (ref 12.0–15.0)
MCH: 29.3 pg (ref 26.0–34.0)
MCHC: 33.4 g/dL (ref 30.0–36.0)
MCV: 87.6 fL (ref 80.0–100.0)
PLATELETS: 293 10*3/uL (ref 150–400)
RBC: 4.58 MIL/uL (ref 3.87–5.11)
RDW: 12.5 % (ref 11.5–15.5)
WBC: 8.3 10*3/uL (ref 4.0–10.5)
nRBC: 0 % (ref 0.0–0.2)

## 2018-11-17 LAB — TROPONIN I

## 2018-11-17 MED ORDER — BUTALBITAL-APAP-CAFFEINE 50-325-40 MG PO TABS: 1.0000 | ORAL_TABLET | Freq: Four times a day (QID) | ORAL | 0 refills | Status: AC | PRN

## 2018-11-17 MED ORDER — BUTALBITAL-APAP-CAFFEINE 50-325-40 MG PO TABS
2.0000 | ORAL_TABLET | Freq: Once | ORAL | Status: AC
  Administered 2018-11-17: 2 via ORAL
  Filled 2018-11-17: qty 2

## 2018-11-17 MED ORDER — IBUPROFEN 600 MG PO TABS
600.0000 mg | ORAL_TABLET | Freq: Once | ORAL | Status: AC
  Administered 2018-11-17: 600 mg via ORAL
  Filled 2018-11-17: qty 1

## 2018-11-17 NOTE — Discharge Instructions (Addendum)
Please increase your home dose of lisinopril to 40mg  by mouth daily.  Follow up with your PMD within 1 week for a recheck and return to the ED for any concerns.  It was a pleasure to take care of you today, and thank you for coming to our emergency department.  If you have any questions or concerns before leaving please ask the nurse to grab me and I'm more than happy to go through your aftercare instructions again.  If you were prescribed any opioid pain medication today such as Norco, Vicodin, Percocet, morphine, hydrocodone, or oxycodone please make sure you do not drive when you are taking this medication as it can alter your ability to drive safely.  If you have any concerns once you are home that you are not improving or are in fact getting worse before you can make it to your follow-up appointment, please do not hesitate to call 911 and come back for further evaluation.  Merrily Brittle, MD  Results for orders placed or performed during the hospital encounter of 11/17/18  Basic metabolic panel  Result Value Ref Range   Sodium 139 135 - 145 mmol/L   Potassium 3.2 (L) 3.5 - 5.1 mmol/L   Chloride 106 98 - 111 mmol/L   CO2 25 22 - 32 mmol/L   Glucose, Bld 200 (H) 70 - 99 mg/dL   BUN 9 6 - 20 mg/dL   Creatinine, Ser 1.61 0.44 - 1.00 mg/dL   Calcium 9.1 8.9 - 09.6 mg/dL   GFR calc non Af Amer >60 >60 mL/min   GFR calc Af Amer >60 >60 mL/min   Anion gap 8 5 - 15  CBC  Result Value Ref Range   WBC 8.3 4.0 - 10.5 K/uL   RBC 4.58 3.87 - 5.11 MIL/uL   Hemoglobin 13.4 12.0 - 15.0 g/dL   HCT 04.5 40.9 - 81.1 %   MCV 87.6 80.0 - 100.0 fL   MCH 29.3 26.0 - 34.0 pg   MCHC 33.4 30.0 - 36.0 g/dL   RDW 91.4 78.2 - 95.6 %   Platelets 293 150 - 400 K/uL   nRBC 0.0 0.0 - 0.2 %  Troponin I - ONCE - STAT  Result Value Ref Range   Troponin I <0.03 <0.03 ng/mL   Dg Chest 2 View  Result Date: 11/17/2018 CLINICAL DATA:  Chest pain. EXAM: CHEST - 2 VIEW COMPARISON:  None. FINDINGS: The  cardiomediastinal contours are normal. The lungs are clear. Pulmonary vasculature is normal. No consolidation, pleural effusion, or pneumothorax. No acute osseous abnormalities are seen. IMPRESSION: No acute chest findings. Electronically Signed   By: Narda Rutherford M.D.   On: 11/17/2018 23:18

## 2018-11-17 NOTE — ED Provider Notes (Signed)
[facility] Emergency Department Provider Note  ____________________________________________   First MD Initiated Contact with Patient 11/17/18 2320     (approximate)  I have reviewed the triage vital signs and the nursing notes.   HISTORY  Chief Complaint Hypertension   HPI Vicki Simmons is a 43 y.o. female who comes to the emergency department with gradual onset not maximal onset bifrontal throbbing headache that is similar to previous headaches she has had in the past.  When she was at home she checked her blood pressure became concerned that it was 170.  She then checked it again and it was 180.  She then checked it again it was 190 and she came to the emergency department because she knew that "hypertension is the silent killer".  Her headache is currently mild in severity and she is more concerned about her blood pressure.  She denies chest pain shortness of breath abdominal pain nausea vomiting double vision or blurred vision.  She does take 30 mg of lisinopril daily and no other antihypertensives.    Past Medical History:  Diagnosis Date  . Diabetes (HCC)   . Hypertension     Patient Active Problem List   Diagnosis Date Noted  . Diabetes (HCC) 12/28/2017  . Hypertension 12/28/2017    Past Surgical History:  Procedure Laterality Date  . CESAREAN SECTION    . TUBAL LIGATION      Prior to Admission medications   Medication Sig Start Date End Date Taking? Authorizing Provider  ALPRAZolam Prudy Feeler) 0.5 MG tablet Take by mouth.    [provider]  butalbital-acetaminophen-caffeine (FIORICET, ESGIC) 50-325-40 MG tablet Take 1-2 tablets by mouth every 6 (six) hours as needed. 11/17/18 11/17/19  Merrily Brittle, MD  cyclobenzaprine (FLEXERIL) 10 MG tablet Take 1 tablet (10 mg total) by mouth at bedtime as needed for muscle spasms. 08/28/18   Scarboro, Coralee North, NP  glimepiride (AMARYL) 4 MG tablet Take 1 tablet (4 mg total) by mouth 2 (two)  times daily. 12/23/17   Carlean Jews, NP  insulin NPH Human (HUMULIN N,NOVOLIN N) 100 UNIT/ML injection Administer 55 units subcutaneously in am, 65 units in evening 02/21/18   [provider]  lisinopril (PRINIVIL,ZESTRIL) 20 MG tablet Take one and half tab a day 12/28/17   Lyndon Code, MD  metFORMIN (GLUCOPHAGE-XR) 500 MG 24 hr tablet Take 1,000 mg by mouth 2 (two) times daily with a meal. 05/29/18   [provider]  naproxen (NAPROSYN) 500 MG tablet Take 1 tablet (500 mg total) by mouth 2 (two) times daily with a meal. 08/31/18   Jene Every, MD  pioglitazone (ACTOS) 15 MG tablet Take 15 mg by mouth daily.    [provider]  traMADol (ULTRAM) 50 MG tablet Take 1 tablet (50 mg total) by mouth every 12 (twelve) hours as needed. 01/24/18   Joni Reining, PA-C    Allergies Patient has no known allergies.  Family History  Problem Relation Age of Onset  . Diabetes Mother   . Hyperlipidemia Mother   . Hypertension Mother   . Heart attack Father   . Stroke Maternal Grandmother     Social History Social History   Tobacco Use  . Smoking status: Current Every Day Smoker  . Smokeless tobacco: Never Used  Substance Use Topics  . Alcohol use: No    Frequency: Never  . Drug use: No    Review of Systems Constitutional: No fever/chills Eyes: No visual changes.  ENT: No sore throat. Cardiovascular: Denies chest pain. Respiratory: Denies shortness of breath. Gastrointestinal: No abdominal pain.  No nausea, no vomiting.  No diarrhea.  No constipation. Genitourinary: Negative for dysuria. Musculoskeletal: Negative for back pain. Skin: Negative for rash. Neurological: Positive for headache   ____________________________________________   PHYSICAL EXAM:  VITAL SIGNS: ED Triage Vitals  Enc Vitals Group     BP 11/17/18 2249 (!) 210/102     Pulse Rate 11/17/18 2249 96     Resp 11/17/18 2249 20     Temp 11/17/18 2249 97.9 F (36.6 C)     Temp Source  11/17/18 2249 Oral     SpO2 11/17/18 2249 100 %     Weight 11/17/18 2254 237 lb (107.5 kg)     Height 11/17/18 2254 5\' 7"  (1.702 m)     Head Circumference --      Peak Flow --      Pain Score 11/17/18 2248 3     Pain Loc --      Pain Edu? --      Excl. in GC? --     Constitutional: Alert and oriented x4 nontoxic no diaphoresis speaks in full clear sentences Eyes: PERRL EOMI. midrange and brisk Head: Atraumatic. Nose: No congestion/rhinnorhea. Mouth/Throat: No trismus Neck: No stridor.  No meningismus Cardiovascular: Normal rate, regular rhythm. Grossly normal heart sounds.  Good peripheral circulation. Respiratory: Normal respiratory effort.  No retractions. Lungs CTAB and moving good air Gastrointestinal: Soft nontender Musculoskeletal: No lower extremity edema   Neurologic:  Normal speech and language. No gross focal neurologic deficits are appreciated. Skin:  Skin is warm, dry and intact. No rash noted. Psychiatric: Mood and affect are normal. Speech and behavior are normal.    ____________________________________________   DIFFERENTIAL includes but not limited to  Hypertensive emergency, hypertensive urgency, asymptomatic hypertension, tension headache ____________________________________________   LABS (all labs ordered are listed, but only abnormal results are displayed)  Labs Reviewed  BASIC METABOLIC PANEL - Abnormal; Notable for the following components:      Result Value   Potassium 3.2 (*)    Glucose, Bld 200 (*)    All other components within normal limits  CBC  TROPONIN I  POC URINE PREG, ED  POCT PREGNANCY, URINE    Lab work reviewed by me with no acute disease __________________________________________  EKG  ED ECG REPORT I, Merrily BrittleNeil Marcoantonio Legault, the attending physician, personally viewed and interpreted this ECG.  Date: 11/19/2018 EKG Time:  Rate: 86 Rhythm: normal sinus rhythm QRS Axis: normal Intervals: normal ST/T Wave abnormalities:  normal Narrative Interpretation: no evidence of acute ischemia  ____________________________________________  RADIOLOGY  Chest x-ray reviewed by me with no acute disease ____________________________________________   PROCEDURES  Procedure(s) performed: no  Procedures  Critical Care performed: no  ____________________________________________   INITIAL IMPRESSION / ASSESSMENT AND PLAN / ED COURSE  Pertinent labs & imaging results that were available during my care of the patient were reviewed by me and considered in my medical decision making (see chart for details).   As part of my medical decision making, I reviewed the following data within the electronic MEDICAL RECORD NUMBER History obtained from family if available, nursing notes, old chart and ekg, as well as notes from prior ED visits.  The patient presents to the emergency department with asymptomatic hypertension.  Labs, EKG, and chest x-ray sent from triage are all reassuring with no renal dysfunction.  Given Fioricet here with improvement in her headache.  I discussed  with the patient maximizing a single antihypertensive prior to switching to a second agent so we will increase her lisinopril to a total of 40 mg a day.  She says she has "plenty" at home and does not need a refill.  Will refer back to primary care within 1 week for a blood pressure recheck.      ____________________________________________   FINAL CLINICAL IMPRESSION(S) / ED DIAGNOSES  Final diagnoses:  Elevated blood pressure reading  Hypertension, unspecified type  Nonintractable headache, unspecified chronicity pattern, unspecified headache type      NEW MEDICATIONS STARTED DURING THIS VISIT:  Discharge Medication List as of 11/17/2018 11:58 PM    START taking these medications   Details  butalbital-acetaminophen-caffeine (FIORICET, ESGIC) 50-325-40 MG tablet Take 1-2 tablets by mouth every 6 (six) hours as needed., Starting Fri 11/17/2018,  Until Sat 11/17/2019, Print         Note:  This document was prepared using Dragon voice recognition software and may include unintentional dictation errors.    Merrily Brittle, MD 11/19/18 [phone]

## 2018-11-17 NOTE — ED Triage Notes (Signed)
Patient c/o hypertension, systolic BP over 734 at home and headache today.

## 2018-11-20 ENCOUNTER — Ambulatory Visit: Payer: Self-pay | Admitting: Nurse Practitioner

## 2018-11-20 ENCOUNTER — Encounter: Payer: Self-pay | Admitting: Nurse Practitioner

## 2018-11-20 VITALS — BP 142/90 | HR 82 | Resp 16 | Ht 67.0 in | Wt 238.0 lb

## 2018-11-20 DIAGNOSIS — E1165 Type 2 diabetes mellitus with hyperglycemia: Secondary | ICD-10-CM

## 2018-11-20 DIAGNOSIS — F411 Generalized anxiety disorder: Secondary | ICD-10-CM

## 2018-11-20 DIAGNOSIS — E876 Hypokalemia: Secondary | ICD-10-CM

## 2018-11-20 DIAGNOSIS — I10 Essential (primary) hypertension: Secondary | ICD-10-CM

## 2018-11-20 MED ORDER — ALPRAZOLAM 0.5 MG PO TABS: 0.5000 mg | ORAL_TABLET | Freq: Two times a day (BID) | ORAL | 2 refills | Status: DC | PRN

## 2018-11-20 MED ORDER — LISINOPRIL 40 MG PO TABS: ORAL_TABLET | ORAL | 3 refills | Status: DC

## 2018-11-20 MED ORDER — HYDROCHLOROTHIAZIDE 12.5 MG PO TABS: 12.5000 mg | ORAL_TABLET | Freq: Every day | ORAL | 3 refills | Status: DC

## 2018-11-20 MED ORDER — POTASSIUM CHLORIDE ER 10 MEQ PO TBCR: 10.0000 meq | EXTENDED_RELEASE_TABLET | Freq: Every day | ORAL | 3 refills | Status: DC

## 2018-11-20 NOTE — Progress Notes (Signed)
[facility] [address] [place], [place] [address]  Internal MEDICINE  Office Visit Note  Patient Name: Vicki Simmons  [id number]  [id number]  Date of Service: 11/29/2018   Pt is here for a sick visit.  Chief Complaint  Patient presents with  . Diabetes  . Hospitalization Follow-up    high Bp and headahes     The patient is here because her blood pressure have been very elevated recently. On 11/17/2018, she went to the ER because her blood pressure was so elevated and she had a terrible headache. ECG was performed and indicated normal sinus rhythm. She was told her blood work was good. Upon further review, potassium level was 3.2. she wsa not given potassium supplementation. She was told that her headache was causing the elevated blood pressure. Was given a prescription for fioricet with instructions for how to take. In short-term, headache and blood pressure both improved. She was discharged to home. The following day, she no longer had headache, but blood pressure was in 200s/100s. Today, she does have mild headache. Blood pressure is only slightly elevated. She feels very fatigued. She denies chest pain, chest pressure, or shortness of breath.        Current Medication:  Outpatient Encounter Medications as of 11/20/2018  Medication Sig  . ALPRAZolam (XANAX) 0.5 MG tablet Take 1 tablet (0.5 mg total) by mouth 2 (two) times daily as needed for anxiety.  . butalbital-acetaminophen-caffeine (FIORICET, ESGIC) 50-325-40 MG tablet Take 1-2 tablets by mouth every 6 (six) hours as needed.  . cyclobenzaprine (FLEXERIL) 10 MG tablet Take 1 tablet (10 mg total) by mouth at bedtime as needed for muscle spasms.  Marland Kitchen glimepiride (AMARYL) 4 MG tablet Take 1 tablet (4 mg total) by mouth 2 (two) times daily.  . insulin NPH Human (HUMULIN N,NOVOLIN N) 100 UNIT/ML injection Administer 55 units subcutaneously in am, 65 units in evening  . lisinopril (PRINIVIL,ZESTRIL) 40 MG tablet Take  one and half tab a day  . metFORMIN (GLUCOPHAGE-XR) 500 MG 24 hr tablet Take 1,000 mg by mouth 2 (two) times daily with a meal.  . naproxen (NAPROSYN) 500 MG tablet Take 1 tablet (500 mg total) by mouth 2 (two) times daily with a meal.  . pioglitazone (ACTOS) 15 MG tablet Take 15 mg by mouth daily.  . traMADol (ULTRAM) 50 MG tablet Take 1 tablet (50 mg total) by mouth every 12 (twelve) hours as needed.  . [DISCONTINUED] ALPRAZolam (XANAX) 0.5 MG tablet Take by mouth.  . [DISCONTINUED] lisinopril (PRINIVIL,ZESTRIL) 20 MG tablet Take one and half tab a day (Patient taking differently: 20 mg. Take 2 tab po daily by hospital)  . hydrochlorothiazide (HYDRODIURIL) 12.5 MG tablet Take 1 tablet (12.5 mg total) by mouth daily.  . potassium chloride (K-DUR) 10 MEQ tablet Take 1 tablet (10 mEq total) by mouth daily.   No facility-administered encounter medications on file as of 11/20/2018.       Medical History: Past Medical History:  Diagnosis Date  . Diabetes (HCC)   . Hypertension      Today's Vitals   11/20/18 1208  BP: (!) 142/90  Pulse: 82  Resp: 16  SpO2: 98%  Weight: 238 lb (108 kg)  Height: 5\' 7"  (1.702 m)   Body mass index is 37.28 kg/m.  Review of Systems  Constitutional: Positive for activity change and fatigue. Negative for chills and unexpected weight change.  HENT: Negative for congestion, postnasal drip, rhinorrhea, sneezing and sore throat.  Respiratory: Negative for cough, chest tightness, shortness of breath and wheezing.   Cardiovascular: Negative for chest pain and palpitations.       Very elevated blood pressure over the past week or two.   Gastrointestinal: Negative for abdominal pain, constipation, diarrhea, nausea and vomiting.  Endocrine:       Blood sugars have been elevated.   Musculoskeletal: Negative for arthralgias, back pain, joint swelling and neck pain.  Skin: Negative for rash.  Neurological: Positive for dizziness and headaches. Negative for  tremors and numbness.  Hematological: Negative for adenopathy. Does not bruise/bleed easily.  Psychiatric/Behavioral: Negative for behavioral problems (Depression), sleep disturbance and suicidal ideas. The patient is nervous/anxious.     Physical Exam Vitals signs and nursing note reviewed.  Constitutional:      General: She is not in acute distress.    Appearance: Normal appearance. She is well-developed. She is not diaphoretic.  HENT:     Head: Normocephalic and atraumatic.     Mouth/Throat:     Pharynx: No oropharyngeal exudate.  Eyes:     Pupils: Pupils are equal, round, and reactive to light.  Neck:     Musculoskeletal: Normal range of motion and neck supple.     Thyroid: No thyromegaly.     Vascular: No JVD.     Trachea: No tracheal deviation.  Cardiovascular:     Rate and Rhythm: Normal rate and regular rhythm.     Heart sounds: Normal heart sounds. No murmur. No friction rub. No gallop.   Pulmonary:     Effort: Pulmonary effort is normal. No respiratory distress.     Breath sounds: Normal breath sounds. No wheezing or rales.  Chest:     Chest wall: No tenderness.  Abdominal:     General: Bowel sounds are normal.     Palpations: Abdomen is soft.  Musculoskeletal: Normal range of motion.  Lymphadenopathy:     Cervical: No cervical adenopathy.  Skin:    General: Skin is warm and dry.     Capillary Refill: Capillary refill takes less than 2 seconds.  Neurological:     General: No focal deficit present.     Mental Status: She is alert and oriented to person, place, and time.     Cranial Nerves: No cranial nerve deficit.  Psychiatric:        Behavior: Behavior normal.        Thought Content: Thought content normal.        Judgment: Judgment normal.   Assessment/Plan: 1. Essential hypertension, benign Lisinopril 40mg  daily. Add HCTZ 12.5mg  daily. DASH diet reviewed and written information provided.  - lisinopril (PRINIVIL,ZESTRIL) 40 MG tablet; Take one and half  tab a day  Dispense: 30 tablet; Refill: 3 - hydrochlorothiazide (HYDRODIURIL) 12.5 MG tablet; Take 1 tablet (12.5 mg total) by mouth daily.  Dispense: 30 tablet; Refill: 3  2. Hypokalemia Potassium level 3.2 in the ER. Adding low dose HCTZ. Start K-dur 10mEq daily. Recheck BMP after follow up visit.  - potassium chloride (K-DUR) 10 MEQ tablet; Take 1 tablet (10 mEq total) by mouth daily.  Dispense: 30 tablet; Refill: 3  3. Generalized anxiety disorder May take alprazolam 0.5mg  twice daily as needed for acute anxiety. New prescription sent to her pharmacy.  - ALPRAZolam (XANAX) 0.5 MG tablet; Take 1 tablet (0.5 mg total) by mouth 2 (two) times daily as needed for anxiety.  Dispense: 45 tablet; Refill: 2  4. Uncontrolled type 2 diabetes mellitus with hyperglycemia (HCC) Continue diabetic  medication as prescribed. Evaluate further at next visit.   General Counseling: Vicki NeriLinda verbalizes understanding of the findings of todays visit and agrees with plan of treatment. I have discussed any further diagnostic evaluation that may be needed or ordered today. We also reviewed her medications today. she has been encouraged to call the office with any questions or concerns that should arise related to todays visit.    Counseling:  Counseling: Adherence of Medical Therapy: The patient understands that it is the responsibility of the patient to complete all prescribed medications, all recommended testing, including but not limited to, laboratory studies and imaging. The patient further understands the need to keep all scheduled follow-up visits and to inform the office immediately of any changes in their medical condition. The patient understands that the success of treatment in large part depends on the patient's willingness to complete the therapeutic regimen and to work in partnership with the designated health-care providers.  Hypertension Counseling:   The following hypertensive lifestyle modification  were recommended and discussed:  1. Limiting alcohol intake to less than 1 oz/day of ethanol:(24 oz of beer or 8 oz of wine or 2 oz of 100-proof whiskey). 2. Take baby ASA 81 mg daily. 3. Importance of regular aerobic exercise and losing weight. 4. Reduce dietary saturated fat and cholesterol intake for overall cardiovascular health. 5. Maintaining adequate dietary potassium, calcium, and magnesium intake. 6. Regular monitoring of the blood pressure. 7. Reduce sodium intake to less than 100 mmol/day (less than 2.3 gm of sodium or less than 6 gm of sodium choride)   This patient was seen by Vincent GrosHeather Lanore Renderos FNP Collaboration with Dr Lyndon CodeFozia M Khan as a part of collaborative care agreement  Meds ordered this encounter  Medications  . lisinopril (PRINIVIL,ZESTRIL) 40 MG tablet    Sig: Take one and half tab a day    Dispense:  30 tablet    Refill:  3    Order Specific Question:   Supervising Provider    Answer:   Lyndon CodeKHAN, FOZIA M [[id number]]  . hydrochlorothiazide (HYDRODIURIL) 12.5 MG tablet    Sig: Take 1 tablet (12.5 mg total) by mouth daily.    Dispense:  30 tablet    Refill:  3    Order Specific Question:   Supervising Provider    Answer:   Lyndon CodeKHAN, FOZIA M [[id number]]  . potassium chloride (K-DUR) 10 MEQ tablet    Sig: Take 1 tablet (10 mEq total) by mouth daily.    Dispense:  30 tablet    Refill:  3    Order Specific Question:   Supervising Provider    Answer:   Lyndon CodeKHAN, FOZIA M [[id number]]  . ALPRAZolam (XANAX) 0.5 MG tablet    Sig: Take 1 tablet (0.5 mg total) by mouth 2 (two) times daily as needed for anxiety.    Dispense:  45 tablet    Refill:  2    Order Specific Question:   Supervising Provider    Answer:   Lyndon CodeKHAN, FOZIA M [[id number]]    Time spent: 30 Minutes

## 2018-11-29 DIAGNOSIS — I10 Essential (primary) hypertension: Secondary | ICD-10-CM | POA: Insufficient documentation

## 2018-11-29 DIAGNOSIS — E876 Hypokalemia: Secondary | ICD-10-CM | POA: Insufficient documentation

## 2018-11-29 DIAGNOSIS — E1165 Type 2 diabetes mellitus with hyperglycemia: Secondary | ICD-10-CM | POA: Insufficient documentation

## 2018-11-29 DIAGNOSIS — F411 Generalized anxiety disorder: Secondary | ICD-10-CM | POA: Insufficient documentation

## 2018-12-11 ENCOUNTER — Ambulatory Visit: Payer: Self-pay | Admitting: Nurse Practitioner

## 2018-12-11 VITALS — BP 123/69 | HR 83 | Resp 16 | Ht 67.0 in | Wt 236.6 lb

## 2018-12-11 DIAGNOSIS — E1165 Type 2 diabetes mellitus with hyperglycemia: Secondary | ICD-10-CM

## 2018-12-11 DIAGNOSIS — I10 Essential (primary) hypertension: Secondary | ICD-10-CM

## 2018-12-11 DIAGNOSIS — E876 Hypokalemia: Secondary | ICD-10-CM

## 2018-12-11 LAB — POCT GLYCOSYLATED HEMOGLOBIN (HGB A1C): HEMOGLOBIN A1C: 8.3 % — AB (ref 4.0–5.6)

## 2018-12-11 MED ORDER — PIOGLITAZONE HCL 15 MG PO TABS: 15.0000 mg | ORAL_TABLET | Freq: Every day | ORAL | 3 refills | Status: DC

## 2018-12-11 MED ORDER — INSULIN NPH (HUMAN) (ISOPHANE) 100 UNIT/ML ~~LOC~~ SUSP: SUBCUTANEOUS | 0 refills | Status: DC

## 2018-12-11 NOTE — Progress Notes (Signed)
[facility] [address] [place], [place] [address]  Internal MEDICINE  Office Visit Note  Patient Name: Vicki Simmons  [id number]  [id number]  Date of Service: 12/12/2018  Chief Complaint  Patient presents with  . Follow-up  . Diabetes  . Hypertension    The patient is here because her blood pressure have been very elevated recently. On 11/17/2018, she went to the ER because her blood pressure was so elevated and she had a terrible headache. ECG was performed and indicated normal sinus rhythm. She was told her blood work was good. Upon further review, potassium level was 3.2. she wsa not given potassium supplementation. At most recent visit, she was started on HCTZ 12.5mg  tablets as well as potassium supplement every day. Blood pressure is much improved. Headache has resolved. Continues lisinopril at 40mg  every day.  Blood sugars have been elevated some recently. Currently using Humulin N, taking 55 units in the morning and 65 units in the evening. Still gets some high readings, mostly first thing in the morning. She continues oral medications as before. HgbA1c is 8.3 today       Current Medication: Outpatient Encounter Medications as of 12/11/2018  Medication Sig  . ALPRAZolam (XANAX) 0.5 MG tablet Take 1 tablet (0.5 mg total) by mouth 2 (two) times daily as needed for anxiety.  . butalbital-acetaminophen-caffeine (FIORICET, ESGIC) 50-325-40 MG tablet Take 1-2 tablets by mouth every 6 (six) hours as needed.  . cyclobenzaprine (FLEXERIL) 10 MG tablet Take 1 tablet (10 mg total) by mouth at bedtime as needed for muscle spasms.  Marland Kitchen glimepiride (AMARYL) 4 MG tablet Take 1 tablet (4 mg total) by mouth 2 (two) times daily.  . hydrochlorothiazide (HYDRODIURIL) 12.5 MG tablet Take 1 tablet (12.5 mg total) by mouth daily.  . insulin NPH Human (HUMULIN N,NOVOLIN N) 100 UNIT/ML injection Inject 60units [place] QAM and 70 units [place] QPM  . lisinopril (PRINIVIL,ZESTRIL) 40 MG tablet  Take one and half tab a day  . metFORMIN (GLUCOPHAGE-XR) 500 MG 24 hr tablet Take 1,000 mg by mouth 2 (two) times daily with a meal.  . naproxen (NAPROSYN) 500 MG tablet Take 1 tablet (500 mg total) by mouth 2 (two) times daily with a meal.  . pioglitazone (ACTOS) 15 MG tablet Take 1 tablet (15 mg total) by mouth daily.  . potassium chloride (K-DUR) 10 MEQ tablet Take 1 tablet (10 mEq total) by mouth daily.  . traMADol (ULTRAM) 50 MG tablet Take 1 tablet (50 mg total) by mouth every 12 (twelve) hours as needed.  . [DISCONTINUED] insulin NPH Human (HUMULIN N,NOVOLIN N) 100 UNIT/ML injection Administer 55 units subcutaneously in am, 65 units in evening  . [DISCONTINUED] pioglitazone (ACTOS) 15 MG tablet Take 15 mg by mouth daily.   No facility-administered encounter medications on file as of 12/11/2018.     Surgical History: Past Surgical History:  Procedure Laterality Date  . CESAREAN SECTION    . TUBAL LIGATION      Medical History: Past Medical History:  Diagnosis Date  . Diabetes (HCC)   . Hypertension     Family History: Family History  Problem Relation Age of Onset  . Diabetes Mother   . Hyperlipidemia Mother   . Hypertension Mother   . Heart attack Father   . Stroke Maternal Grandmother     Social History   Socioeconomic History  . Marital status: Married    Spouse name: Not on file  . Number of children: Not on  file  . Years of education: Not on file  . Highest education level: Not on file  Occupational History  . Not on file  Social Needs  . Financial resource strain: Not on file  . Food insecurity:    Worry: Not on file    Inability: Not on file  . Transportation needs:    Medical: Not on file    Non-medical: Not on file  Tobacco Use  . Smoking status: Current Every Day Smoker  . Smokeless tobacco: Never Used  Substance and Sexual Activity  . Alcohol use: No    Frequency: Never  . Drug use: No  . Sexual activity: Not on file  Lifestyle  . Physical  activity:    Days per week: Not on file    Minutes per session: Not on file  . Stress: Not on file  Relationships  . Social connections:    Talks on phone: Not on file    Gets together: Not on file    Attends religious service: Not on file    Active member of club or organization: Not on file    Attends meetings of clubs or organizations: Not on file    Relationship status: Not on file  . Intimate partner violence:    Fear of current or ex partner: Not on file    Emotionally abused: Not on file    Physically abused: Not on file    Forced sexual activity: Not on file  Other Topics Concern  . Not on file  Social History Narrative  . Not on file      Review of Systems  Constitutional: Positive for fatigue. Negative for activity change, chills and unexpected weight change.  HENT: Negative for congestion, postnasal drip, rhinorrhea, sneezing and sore throat.   Respiratory: Negative for cough, chest tightness, shortness of breath and wheezing.   Cardiovascular: Negative for chest pain and palpitations.       Blood pressure improved since her last visit   Gastrointestinal: Negative for abdominal pain, constipation, diarrhea, nausea and vomiting.  Endocrine: Negative for cold intolerance, heat intolerance, polydipsia and polyuria.       Blood sugars have been elevated.   Musculoskeletal: Negative for arthralgias, back pain, joint swelling and neck pain.  Skin: Negative for rash.  Neurological: Positive for dizziness and headaches. Negative for tremors and numbness.  Hematological: Negative for adenopathy. Does not bruise/bleed easily.  Psychiatric/Behavioral: Negative for behavioral problems (Depression), sleep disturbance and suicidal ideas. The patient is nervous/anxious.     Today's Vitals   12/11/18 1146  BP: 123/69  Pulse: 83  Resp: 16  SpO2: 98%  Weight: 236 lb 9.6 oz (107.3 kg)  Height: 5\' 7"  (1.702 m)   Body mass index is 37.06 kg/m.   Physical Exam Vitals signs  and nursing note reviewed.  Constitutional:      General: She is not in acute distress.    Appearance: Normal appearance. She is well-developed. She is not diaphoretic.  HENT:     Head: Normocephalic and atraumatic.     Mouth/Throat:     Pharynx: No oropharyngeal exudate.  Eyes:     Pupils: Pupils are equal, round, and reactive to light.  Neck:     Musculoskeletal: Normal range of motion and neck supple.     Thyroid: No thyromegaly.     Vascular: No JVD.     Trachea: No tracheal deviation.  Cardiovascular:     Rate and Rhythm: Normal rate and regular rhythm.  Heart sounds: Normal heart sounds. No murmur. No friction rub. No gallop.   Pulmonary:     Effort: Pulmonary effort is normal. No respiratory distress.     Breath sounds: Normal breath sounds. No wheezing or rales.  Chest:     Chest wall: No tenderness.  Abdominal:     General: Bowel sounds are normal.     Palpations: Abdomen is soft.  Musculoskeletal: Normal range of motion.  Lymphadenopathy:     Cervical: No cervical adenopathy.  Skin:    General: Skin is warm and dry.     Capillary Refill: Capillary refill takes less than 2 seconds.  Neurological:     General: No focal deficit present.     Mental Status: She is alert and oriented to person, place, and time.     Cranial Nerves: No cranial nerve deficit.  Psychiatric:        Behavior: Behavior normal.        Thought Content: Thought content normal.        Judgment: Judgment normal.   Assessment/Plan: 1. Type 2 diabetes mellitus with hyperglycemia, unspecified whether long term insulin use (HCC) - POCT HgB A1C 8.3 today. Increase Humulin N to 60 units in AM and 70 units in PM. Continue oral medications as prescribed. Continue to limit intake of sugar and carbohydrates in the diet and exercise routinely.  - pioglitazone (ACTOS) 15 MG tablet; Take 1 tablet (15 mg total) by mouth daily.  Dispense: 30 tablet; Refill: 3 - insulin NPH Human (HUMULIN N,NOVOLIN N) 100  UNIT/ML injection; Inject 60units [place] QAM and 70 units [place] QPM  Dispense: 10 mL; Refill: 0  2. Essential hypertension, benign Much improved. Continue with added HCTZ 12.5mg  tablets daily and lisinoptil 40mg  every day.   3. Hypokalemia Continue potassium supplement every day.   General Counseling: Vicki Simmons verbalizes understanding of the findings of todays visit and agrees with plan of treatment. I have discussed any further diagnostic evaluation that may be needed or ordered today. We also reviewed her medications today. she has been encouraged to call the office with any questions or concerns that should arise related to todays visit.  Diabetes Counseling:  1. Addition of ACE inh/ ARB'S for nephroprotection. Microalbumin is updated  2. Diabetic foot care, prevention of complications. Podiatry consult 3. Exercise and lose weight.  4. Diabetic eye examination, Diabetic eye exam is updated  5. Monitor blood sugar closlely. nutrition counseling.  6. Sign and symptoms of hypoglycemia including shaking sweating,confusion and headaches.  This patient was seen by Vincent GrosHeather Antonie Borjon FNP Collaboration with Dr Lyndon CodeFozia M Khan as a part of collaborative care agreement  Orders Placed This Encounter  Procedures  . POCT HgB A1C    Meds ordered this encounter  Medications  . pioglitazone (ACTOS) 15 MG tablet    Sig: Take 1 tablet (15 mg total) by mouth daily.    Dispense:  30 tablet    Refill:  3    Order Specific Question:   Supervising Provider    Answer:   Lyndon CodeKHAN, FOZIA M [[id number]]  . insulin NPH Human (HUMULIN N,NOVOLIN N) 100 UNIT/ML injection    Sig: Inject 60units [place] QAM and 70 units [place] QPM    Dispense:  10 mL    Refill:  0    Over the counter    Order Specific Question:   Supervising Provider    Answer:   Lyndon CodeKHAN, FOZIA M [[id number]]    Time spent: 25 Minutes  Dr Lavera Guise Internal medicine

## 2018-12-12 ENCOUNTER — Encounter: Payer: Self-pay | Admitting: Nurse Practitioner

## 2019-03-05 ENCOUNTER — Other Ambulatory Visit: Payer: Self-pay

## 2019-03-05 DIAGNOSIS — I10 Essential (primary) hypertension: Secondary | ICD-10-CM

## 2019-03-05 MED ORDER — HYDROCHLOROTHIAZIDE 12.5 MG PO TABS: 12.5000 mg | ORAL_TABLET | Freq: Every day | ORAL | 3 refills | Status: DC

## 2019-03-16 ENCOUNTER — Other Ambulatory Visit: Payer: Self-pay

## 2019-03-16 ENCOUNTER — Encounter: Payer: Self-pay | Admitting: Nurse Practitioner

## 2019-03-16 ENCOUNTER — Ambulatory Visit: Payer: Self-pay | Admitting: Nurse Practitioner

## 2019-03-16 VITALS — BP 119/73 | HR 87 | Resp 16 | Ht 67.0 in | Wt 255.6 lb

## 2019-03-16 DIAGNOSIS — F411 Generalized anxiety disorder: Secondary | ICD-10-CM

## 2019-03-16 DIAGNOSIS — I10 Essential (primary) hypertension: Secondary | ICD-10-CM

## 2019-03-16 DIAGNOSIS — E1165 Type 2 diabetes mellitus with hyperglycemia: Secondary | ICD-10-CM

## 2019-03-16 DIAGNOSIS — E876 Hypokalemia: Secondary | ICD-10-CM

## 2019-03-16 DIAGNOSIS — R635 Abnormal weight gain: Secondary | ICD-10-CM

## 2019-03-16 LAB — POCT GLYCOSYLATED HEMOGLOBIN (HGB A1C): Hemoglobin A1C: 7.5 % — AB (ref 4.0–5.6)

## 2019-03-16 MED ORDER — LISINOPRIL 40 MG PO TABS: ORAL_TABLET | ORAL | 3 refills | Status: DC

## 2019-03-16 MED ORDER — ALPRAZOLAM 0.5 MG PO TABS: 0.5000 mg | ORAL_TABLET | Freq: Two times a day (BID) | ORAL | 2 refills | Status: DC | PRN

## 2019-03-16 MED ORDER — HYDROCHLOROTHIAZIDE 12.5 MG PO TABS: 12.5000 mg | ORAL_TABLET | Freq: Every day | ORAL | 3 refills | Status: DC

## 2019-03-16 MED ORDER — PHENTERMINE HCL 37.5 MG PO TABS: 37.5000 mg | ORAL_TABLET | Freq: Every day | ORAL | 1 refills | Status: DC

## 2019-03-16 MED ORDER — POTASSIUM CHLORIDE ER 10 MEQ PO TBCR: 10.0000 meq | EXTENDED_RELEASE_TABLET | Freq: Every day | ORAL | 3 refills | Status: DC

## 2019-03-16 NOTE — Progress Notes (Signed)
[facility] [address] [place], [place] [address]  Internal MEDICINE  Office Visit Note  Patient Name: Vicki Simmons  [id number]  [id number]  Date of Service: 04/01/2019  Chief Complaint  Patient presents with  . Medical Management of Chronic Issues    3 month follow up  . Diabetes    A1C    The patient is doing very well recently. She states she has noted big improvements in both her blood pressure and blood sugar. Her biggest concern is weight gain since she started taking higher doses of insulin and taking actos. She states that her calorie intake is limited to 1200-1500 calories per day and she is exercising most days.       Current Medication: Outpatient Encounter Medications as of 03/16/2019  Medication Sig  . ALPRAZolam (XANAX) 0.5 MG tablet Take 1 tablet (0.5 mg total) by mouth 2 (two) times daily as needed for anxiety.  . butalbital-acetaminophen-caffeine (FIORICET, ESGIC) 50-325-40 MG tablet Take 1-2 tablets by mouth every 6 (six) hours as needed.  . cyclobenzaprine (FLEXERIL) 10 MG tablet Take 1 tablet (10 mg total) by mouth at bedtime as needed for muscle spasms.  Marland Kitchen glimepiride (AMARYL) 4 MG tablet Take 1 tablet (4 mg total) by mouth 2 (two) times daily.  . hydrochlorothiazide (HYDRODIURIL) 12.5 MG tablet Take 1 tablet (12.5 mg total) by mouth daily.  . insulin NPH Human (HUMULIN N,NOVOLIN N) 100 UNIT/ML injection Inject 60units [place] QAM and 70 units [place] QPM  . lisinopril (ZESTRIL) 40 MG tablet Take 1 tablet po QD  . naproxen (NAPROSYN) 500 MG tablet Take 1 tablet (500 mg total) by mouth 2 (two) times daily with a meal.  . potassium chloride (K-DUR) 10 MEQ tablet Take 1 tablet (10 mEq total) by mouth daily.  . traMADol (ULTRAM) 50 MG tablet Take 1 tablet (50 mg total) by mouth every 12 (twelve) hours as needed.  . [DISCONTINUED] ALPRAZolam (XANAX) 0.5 MG tablet Take 1 tablet (0.5 mg total) by mouth 2 (two) times daily as needed for anxiety.  .  [DISCONTINUED] hydrochlorothiazide (HYDRODIURIL) 12.5 MG tablet Take 1 tablet (12.5 mg total) by mouth daily.  . [DISCONTINUED] lisinopril (PRINIVIL,ZESTRIL) 40 MG tablet Take one and half tab a day  . [DISCONTINUED] metFORMIN (GLUCOPHAGE-XR) 500 MG 24 hr tablet Take 1,000 mg by mouth 2 (two) times daily with a meal.  . [DISCONTINUED] pioglitazone (ACTOS) 15 MG tablet Take 1 tablet (15 mg total) by mouth daily.  . [DISCONTINUED] potassium chloride (K-DUR) 10 MEQ tablet Take 1 tablet (10 mEq total) by mouth daily.  . phentermine (ADIPEX-P) 37.5 MG tablet Take 1 tablet (37.5 mg total) by mouth daily before breakfast.   No facility-administered encounter medications on file as of 03/16/2019.     Surgical History: Past Surgical History:  Procedure Laterality Date  . CESAREAN SECTION    . TUBAL LIGATION      Medical History: Past Medical History:  Diagnosis Date  . Diabetes (HCC)   . Hypertension     Family History: Family History  Problem Relation Age of Onset  . Diabetes Mother   . Hyperlipidemia Mother   . Hypertension Mother   . Heart attack Father   . Stroke Maternal Grandmother     Social History   Socioeconomic History  . Marital status: Married    Spouse name: Not on file  . Number of children: Not on file  . Years of education: Not on file  . Highest education level:  Not on file  Occupational History  . Not on file  Social Needs  . Financial resource strain: Not on file  . Food insecurity:    Worry: Not on file    Inability: Not on file  . Transportation needs:    Medical: Not on file    Non-medical: Not on file  Tobacco Use  . Smoking status: Current Every Day Smoker  . Smokeless tobacco: Never Used  Substance and Sexual Activity  . Alcohol use: No    Frequency: Never  . Drug use: No  . Sexual activity: Not on file  Lifestyle  . Physical activity:    Days per week: Not on file    Minutes per session: Not on file  . Stress: Not on file   Relationships  . Social connections:    Talks on phone: Not on file    Gets together: Not on file    Attends religious service: Not on file    Active member of club or organization: Not on file    Attends meetings of clubs or organizations: Not on file    Relationship status: Not on file  . Intimate partner violence:    Fear of current or ex partner: Not on file    Emotionally abused: Not on file    Physically abused: Not on file    Forced sexual activity: Not on file  Other Topics Concern  . Not on file  Social History Narrative  . Not on file      Review of Systems  Constitutional: Negative for activity change, chills, fatigue and unexpected weight change.       Weight gain of nearly 20 pounds over past few months.   HENT: Negative for congestion, postnasal drip, rhinorrhea, sneezing and sore throat.   Respiratory: Negative for cough, chest tightness, shortness of breath and wheezing.   Cardiovascular: Negative for chest pain and palpitations.       Blood pressure improved since her last visit   Gastrointestinal: Negative for abdominal pain, constipation, diarrhea, nausea and vomiting.  Endocrine: Negative for cold intolerance, heat intolerance, polydipsia and polyuria.       Improved blood sugars.   Musculoskeletal: Negative for arthralgias, back pain, joint swelling and neck pain.  Skin: Negative for rash.  Neurological: Positive for headaches. Negative for dizziness, tremors and numbness.  Hematological: Negative for adenopathy. Does not bruise/bleed easily.  Psychiatric/Behavioral: Negative for behavioral problems (Depression), sleep disturbance and suicidal ideas. The patient is nervous/anxious.     Today's Vitals   03/16/19 1147  BP: 119/73  Pulse: 87  Resp: 16  SpO2: 97%  Weight: 255 lb 9.6 oz (115.9 kg)  Height: 5\' 7"  (1.702 m)   Body mass index is 40.03 kg/m.  Physical Exam Vitals signs and nursing note reviewed.  Constitutional:      General: She is  not in acute distress.    Appearance: Normal appearance. She is well-developed. She is not diaphoretic.  HENT:     Head: Normocephalic and atraumatic.     Mouth/Throat:     Pharynx: No oropharyngeal exudate.  Eyes:     Pupils: Pupils are equal, round, and reactive to light.  Neck:     Musculoskeletal: Normal range of motion and neck supple.     Thyroid: No thyromegaly.     Vascular: No JVD.     Trachea: No tracheal deviation.  Cardiovascular:     Rate and Rhythm: Normal rate and regular rhythm.  Heart sounds: Normal heart sounds. No murmur. No friction rub. No gallop.   Pulmonary:     Effort: Pulmonary effort is normal. No respiratory distress.     Breath sounds: Normal breath sounds. No wheezing or rales.  Chest:     Chest wall: No tenderness.  Abdominal:     General: Bowel sounds are normal.     Palpations: Abdomen is soft.  Musculoskeletal: Normal range of motion.  Lymphadenopathy:     Cervical: No cervical adenopathy.  Skin:    General: Skin is warm and dry.     Capillary Refill: Capillary refill takes less than 2 seconds.  Neurological:     General: No focal deficit present.     Mental Status: She is alert and oriented to person, place, and time.     Cranial Nerves: No cranial nerve deficit.  Psychiatric:        Behavior: Behavior normal.        Thought Content: Thought content normal.        Judgment: Judgment normal.    Assessment/Plan: 1. Uncontrolled type 2 diabetes mellitus with hyperglycemia (HCC) - POCT HgB A1C 7.5 today, down from 8.3 at her last visit. No changes made to diabetic medications today. Monitor closely.   2. Essential hypertension, benign Stable. Continue bp medication as prescribed  - hydrochlorothiazide (HYDRODIURIL) 12.5 MG tablet; Take 1 tablet (12.5 mg total) by mouth daily.  Dispense: 90 tablet; Refill: 3 - lisinopril (ZESTRIL) 40 MG tablet; Take 1 tablet po QD  Dispense: 90 tablet; Refill: 3  3. Hypokalemia Continue k-dur  every day.  - potassium chloride (K-DUR) 10 MEQ tablet; Take 1 tablet (10 mEq total) by mouth daily.  Dispense: 90 tablet; Refill: 3  4. Generalized anxiety disorder May take alprazolam 0.5mg  twice daily if needed for acute anxiety. New prescription sent to her pharmacy today.  - ALPRAZolam (XANAX) 0.5 MG tablet; Take 1 tablet (0.5 mg total) by mouth 2 (two) times daily as needed for anxiety.  Dispense: 45 tablet; Refill: 2  5. Abnormal weight gain Start phentermine 37.5mg  tablets daily. Advised her to limit calorie intake to 1200-1500 calories per day. Incorporate 30-16minutes of exercise into daily routine.  - phentermine (ADIPEX-P) 37.5 MG tablet; Take 1 tablet (37.5 mg total) by mouth daily before breakfast.  Dispense: 30 tablet; Refill: 1  General Counseling: Peni verbalizes understanding of the findings of todays visit and agrees with plan of treatment. I have discussed any further diagnostic evaluation that may be needed or ordered today. We also reviewed her medications today. she has been encouraged to call the office with any questions or concerns that should arise related to todays visit.  Diabetes Counseling:  1. Addition of ACE inh/ ARB'S for nephroprotection. Microalbumin is updated  2. Diabetic foot care, prevention of complications. Podiatry consult 3. Exercise and lose weight.  4. Diabetic eye examination, Diabetic eye exam is updated  5. Monitor blood sugar closlely. nutrition counseling.  6. Sign and symptoms of hypoglycemia including shaking sweating,confusion and headaches.   There is a liability release in patients' chart. There has been a 10 minute discussion about the side effects including but not limited to elevated blood pressure, anxiety, lack of sleep and dry mouth. Pt understands and will like to start/continue on appetite suppressant at this time. There will be one month RX given at the time of visit with proper follow up. Nova diet plan with restricted  calories is given to the pt. Pt understands and  agrees with  plan of treatment  This patient was seen by Vincent Gros FNP Collaboration with Dr Lyndon Code as a part of collaborative care agreement  Orders Placed This Encounter  Procedures  . POCT HgB A1C    Meds ordered this encounter  Medications  . potassium chloride (K-DUR) 10 MEQ tablet    Sig: Take 1 tablet (10 mEq total) by mouth daily.    Dispense:  90 tablet    Refill:  3    Order Specific Question:   Supervising Provider    Answer:   Lyndon Code [[id number]]  . hydrochlorothiazide (HYDRODIURIL) 12.5 MG tablet    Sig: Take 1 tablet (12.5 mg total) by mouth daily.    Dispense:  90 tablet    Refill:  3    Order Specific Question:   Supervising Provider    Answer:   Lyndon Code [[id number]]  . ALPRAZolam (XANAX) 0.5 MG tablet    Sig: Take 1 tablet (0.5 mg total) by mouth 2 (two) times daily as needed for anxiety.    Dispense:  45 tablet    Refill:  2    Order Specific Question:   Supervising Provider    Answer:   Lyndon Code [[id number]]  . lisinopril (ZESTRIL) 40 MG tablet    Sig: Take 1 tablet po QD    Dispense:  90 tablet    Refill:  3    Please note that patient should be taking 40mg , just one tablet daily.    Order Specific Question:   Supervising Provider    Answer:   Lyndon Code [[id number]]  . phentermine (ADIPEX-P) 37.5 MG tablet    Sig: Take 1 tablet (37.5 mg total) by mouth daily before breakfast.    Dispense:  30 tablet    Refill:  1    Order Specific Question:   Supervising Provider    Answer:   Lyndon Code [[id number]]    Time spent: 38 Minutes      Dr Lyndon Code Internal medicine

## 2019-03-21 ENCOUNTER — Encounter: Payer: Self-pay | Admitting: Nurse Practitioner

## 2019-03-27 IMAGING — CT CT RENAL STONE PROTOCOL
2 of 4 series · 17 of 46 positions shown, 19 images · non-contrast
Comparison: CT abdomen and pelvis 10/26/2012.

CLINICAL DATA: LEFT flank pain for several days.

EXAM:
CT ABDOMEN AND PELVIS WITHOUT CONTRAST
TECHNIQUE: Multidetector CT imaging of the abdomen and pelvis was performed
following the standard protocol without IV contrast.

[Series 2: stone full standard · axial · 0.94mm/px · z∈[-811,-336]mm · 14 of 105 slices shown, 16 images]
[im 5/105  soft-tissue]
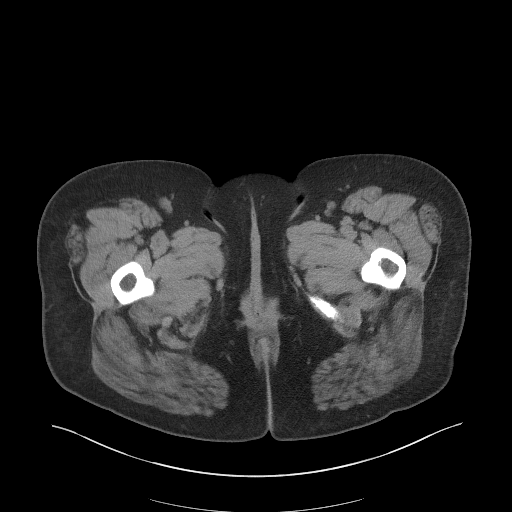
[im 5/105  bone]
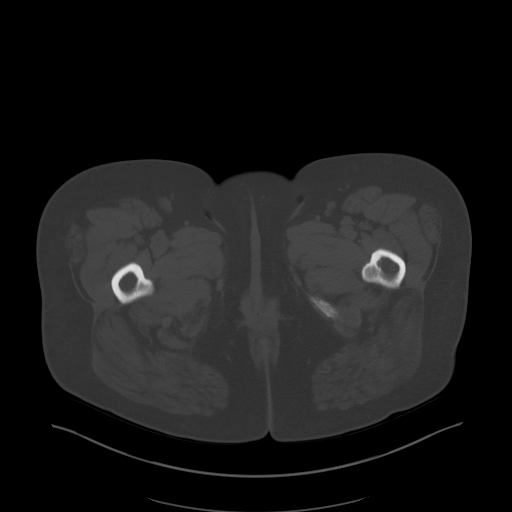
[im 14/105  soft-tissue]
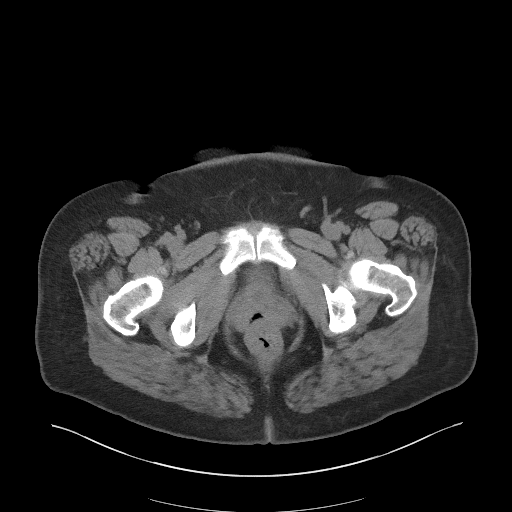
[im 22/105  soft-tissue]
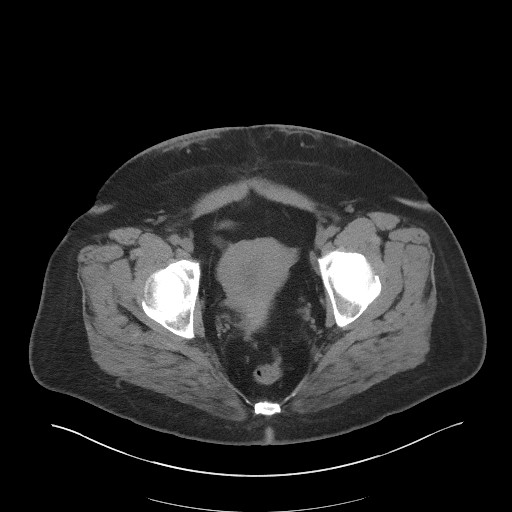
[im 27/105  soft-tissue]
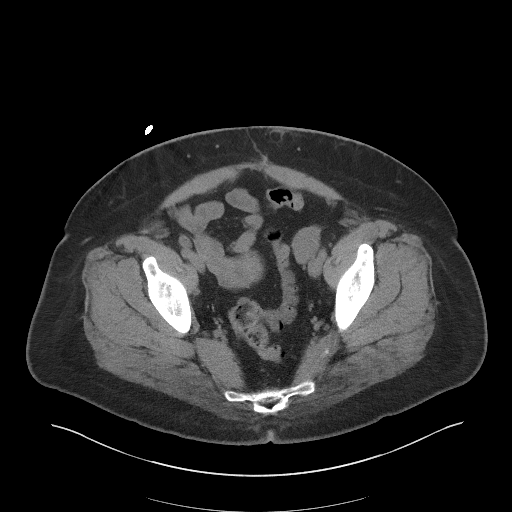
[im 35/105  soft-tissue]
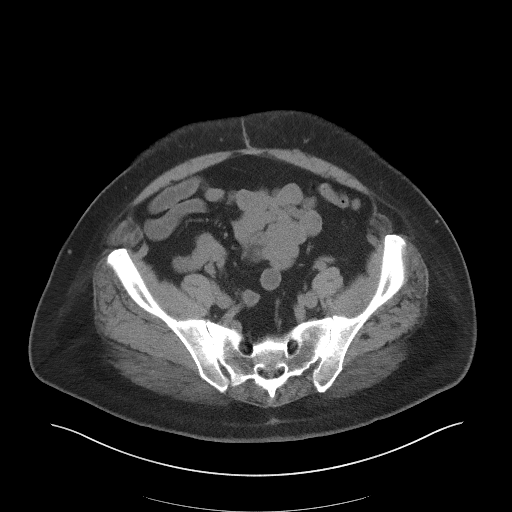
[im 44/105  soft-tissue]
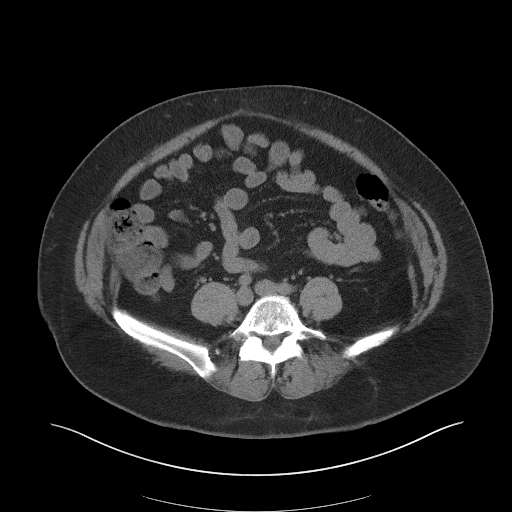
[im 48/105  soft-tissue]
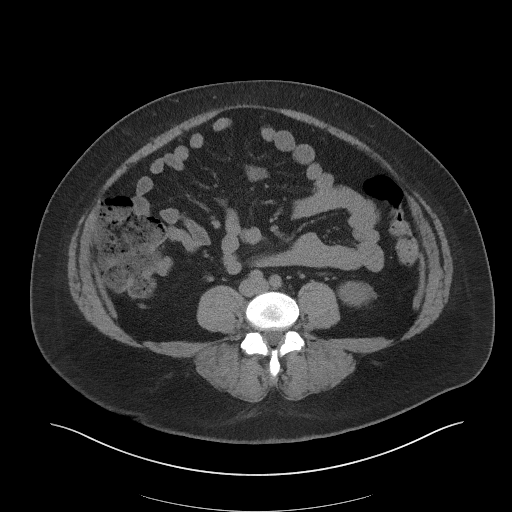
[im 57/105  soft-tissue]
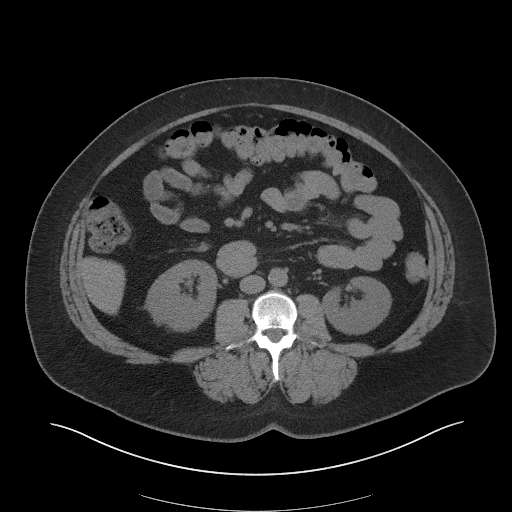
[im 61/105  soft-tissue]
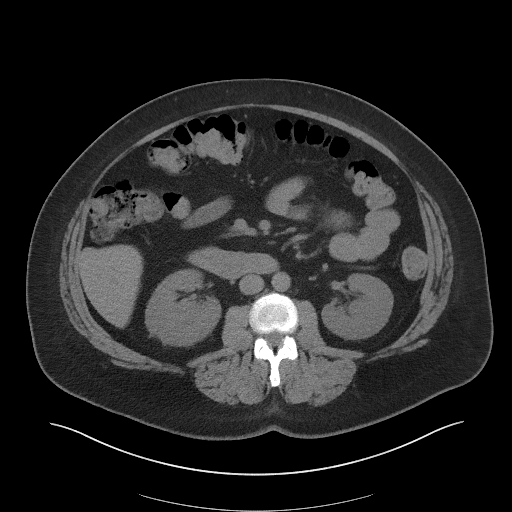
[im 61/105  bone]
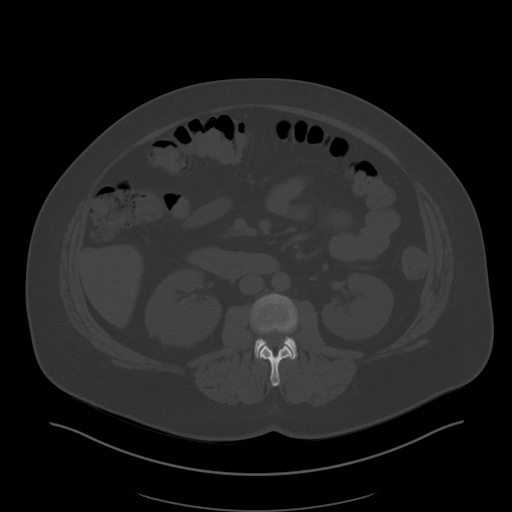
[im 70/105  soft-tissue]
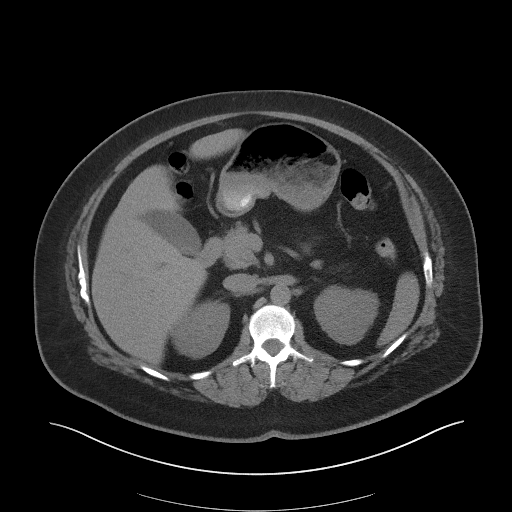
[im 79/105  soft-tissue]
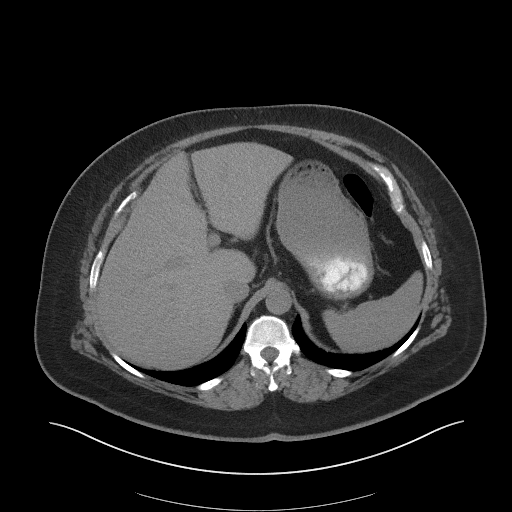
[im 83/105  soft-tissue]
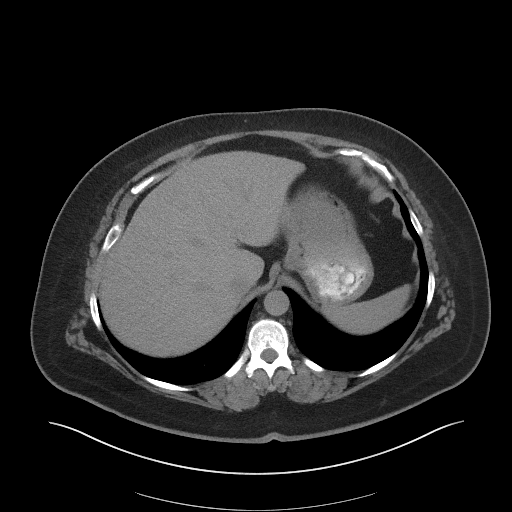
[im 92/105  soft-tissue]
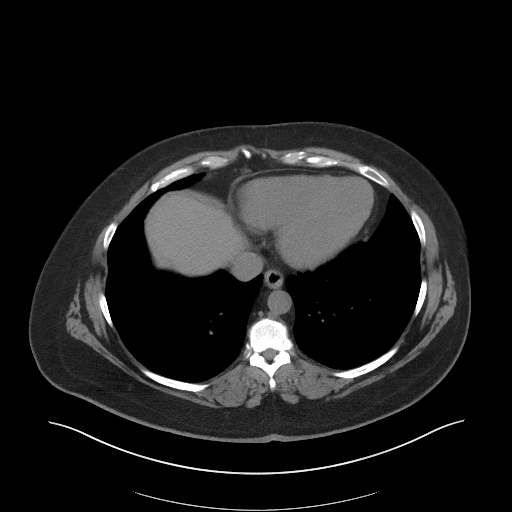
[im 100/105  soft-tissue]
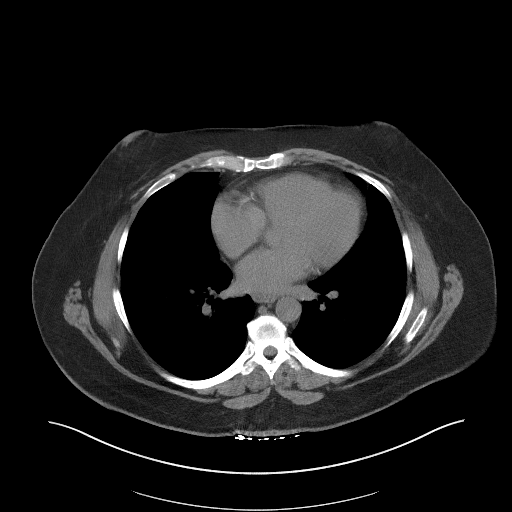

[Series 5: coronal · coronal · 0.93mm/px · 3 of 166 slices shown]
[im 56/166  soft-tissue]
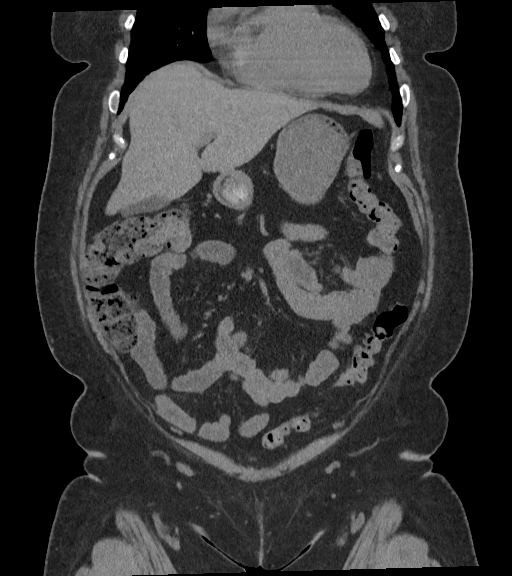
[im 74/166  soft-tissue]
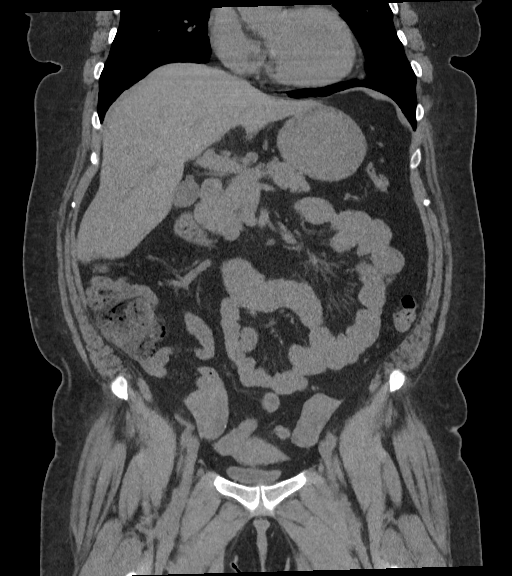
[im 92/166  soft-tissue]
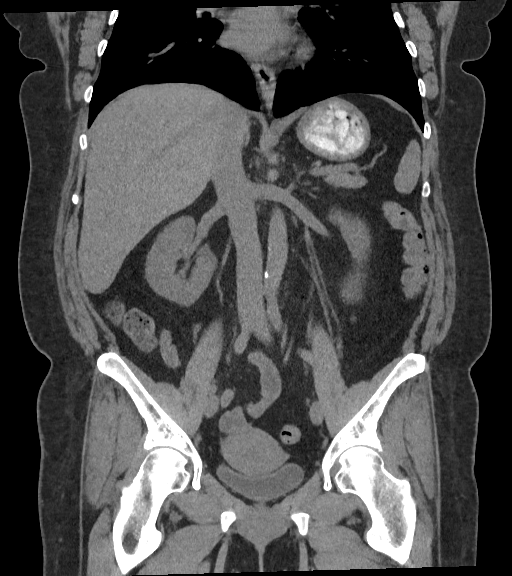

[17 of 46 positions shown; findings below may reference images not displayed]

FINDINGS: Lower chest: No acute abnormality.

Hepatobiliary: No focal liver abnormality is seen. No gallstones,
gallbladder wall thickening, or biliary dilatation.

Pancreas: Unremarkable. No pancreatic ductal dilatation or
surrounding inflammatory changes.

Spleen: Normal in size without focal abnormality.

Adrenals/Urinary Tract: Adrenal glands are unremarkable. Kidneys are
normal, without renal calculi, focal lesion, or hydronephrosis.
Bladder is unremarkable.

Stomach/Bowel: Stomach is within normal limits. Appendix appears
normal. No evidence of bowel wall thickening, distention, or
inflammatory changes. Sigmoid diverticulosis without changes of
diverticulitis.

Vascular/Lymphatic: Aortic atherosclerosis. No enlarged abdominal or
pelvic lymph nodes.

Reproductive: Uterus and bilateral adnexa are unremarkable.

Other: No abdominal wall hernia or abnormality. No abdominopelvic
ascites.

Musculoskeletal: No acute or significant osseous findings.
Degenerative disc disease most pronounced at L5-S1.
IMPRESSION: No evidence of nephrolithiasis or obstructive uropathy.

No acute abnormality in the pelvis.

## 2019-03-30 ENCOUNTER — Other Ambulatory Visit: Payer: Self-pay | Admitting: Nurse Practitioner

## 2019-03-30 ENCOUNTER — Telehealth: Payer: Self-pay | Admitting: Nurse Practitioner

## 2019-03-30 DIAGNOSIS — E1165 Type 2 diabetes mellitus with hyperglycemia: Secondary | ICD-10-CM

## 2019-03-30 MED ORDER — METFORMIN HCL ER 500 MG PO TB24: 1000.0000 mg | ORAL_TABLET | Freq: Two times a day (BID) | ORAL | 3 refills | Status: DC

## 2019-03-30 MED ORDER — PIOGLITAZONE HCL 15 MG PO TABS: 30.0000 mg | ORAL_TABLET | Freq: Every day | ORAL | 3 refills | Status: DC

## 2019-03-30 NOTE — Progress Notes (Signed)
Increase to two tablets daily and d/c metformin due to metformin recall. Sent prescription for actos 15mg - take 2 tablets daily t her pharmacy.

## 2019-03-30 NOTE — Telephone Encounter (Signed)
Increase to two tablets daily and d/c metformin due to metformin recall. Sent prescription for actos 15mg  - take 2 tablets daily t her pharmacy.

## 2019-03-30 NOTE — Telephone Encounter (Signed)
PATIENT CALLED AND STATES UNABLE TO GET METFORMIN FILLED DUE TO RECALL , NEEDS A DIFFERENT MEDICATION CALLED IN

## 2019-03-30 NOTE — Telephone Encounter (Signed)
Can you call the pharmacy? They should be able to switch to different manufacturer of metformin. Not all of the brands were recalled I thought.

## 2019-04-01 DIAGNOSIS — R635 Abnormal weight gain: Secondary | ICD-10-CM | POA: Insufficient documentation

## 2019-04-16 ENCOUNTER — Encounter: Payer: Self-pay | Admitting: Nurse Practitioner

## 2019-04-16 ENCOUNTER — Ambulatory Visit: Payer: Self-pay | Admitting: Nurse Practitioner

## 2019-04-16 ENCOUNTER — Other Ambulatory Visit: Payer: Self-pay

## 2019-04-16 VITALS — BP 143/78 | HR 87 | Resp 16 | Ht 67.0 in | Wt 255.6 lb

## 2019-04-16 DIAGNOSIS — Z6841 Body Mass Index (BMI) 40.0 and over, adult: Secondary | ICD-10-CM | POA: Insufficient documentation

## 2019-04-16 DIAGNOSIS — E1165 Type 2 diabetes mellitus with hyperglycemia: Secondary | ICD-10-CM

## 2019-04-16 DIAGNOSIS — I10 Essential (primary) hypertension: Secondary | ICD-10-CM

## 2019-04-16 MED ORDER — METFORMIN HCL ER 500 MG PO TB24: ORAL_TABLET | ORAL | 5 refills | Status: DC

## 2019-04-16 NOTE — Progress Notes (Signed)
[facility] [address] [place], [place] [id number]  Internal MEDICINE  Office Visit Note  Patient Name: Vicki Simmons  [id number]  [id number]  Date of Service: 04/16/2019  Chief Complaint  Patient presents with  . Medical Management of Chronic Issues    weight management    The patient is here for follow up of weight loss. She was started on phentermine to help with weight loss. She had to stop taking this because it was keeping her awake at night and making her very constipated. Also had to temporarily discontinue metformin as it was recalled. Increased actos 15mg  to two tablets daily. She states that blood sugars have definitely been more elevated. They are generally running around 200 or more first thing in the morning. Unsure if manufacturer has been changed to end metformin recall.       Current Medication: Outpatient Encounter Medications as of 04/16/2019  Medication Sig  . ALPRAZolam (XANAX) 0.5 MG tablet Take 1 tablet (0.5 mg total) by mouth 2 (two) times daily as needed for anxiety.  . butalbital-acetaminophen-caffeine (FIORICET, ESGIC) 50-325-40 MG tablet Take 1-2 tablets by mouth every 6 (six) hours as needed.  . cyclobenzaprine (FLEXERIL) 10 MG tablet Take 1 tablet (10 mg total) by mouth at bedtime as needed for muscle spasms.  Marland Kitchen. glimepiride (AMARYL) 4 MG tablet Take 1 tablet (4 mg total) by mouth 2 (two) times daily.  . hydrochlorothiazide (HYDRODIURIL) 12.5 MG tablet Take 1 tablet (12.5 mg total) by mouth daily.  . insulin NPH Human (HUMULIN N,NOVOLIN N) 100 UNIT/ML injection Inject 60units [place] QAM and 70 units [place] QPM  . lisinopril (ZESTRIL) 40 MG tablet Take 1 tablet po QD  . naproxen (NAPROSYN) 500 MG tablet Take 1 tablet (500 mg total) by mouth 2 (two) times daily with a meal.  . pioglitazone (ACTOS) 15 MG tablet Take 2 tablets (30 mg total) by mouth daily.  . potassium chloride (K-DUR) 10 MEQ tablet Take 1 tablet (10 mEq total) by mouth daily.  .  traMADol (ULTRAM) 50 MG tablet Take 1 tablet (50 mg total) by mouth every 12 (twelve) hours as needed.  . [DISCONTINUED] phentermine (ADIPEX-P) 37.5 MG tablet Take 1 tablet (37.5 mg total) by mouth daily before breakfast.  . metFORMIN (GLUCOPHAGE XR) 500 MG 24 hr tablet Take 2 tablets po BID   No facility-administered encounter medications on file as of 04/16/2019.     Surgical History: Past Surgical History:  Procedure Laterality Date  . CESAREAN SECTION    . TUBAL LIGATION      Medical History: Past Medical History:  Diagnosis Date  . Diabetes (HCC)   . Hypertension     Family History: Family History  Problem Relation Age of Onset  . Diabetes Mother   . Hyperlipidemia Mother   . Hypertension Mother   . Heart attack Father   . Stroke Maternal Grandmother     Social History   Socioeconomic History  . Marital status: Married    Spouse name: Not on file  . Number of children: Not on file  . Years of education: Not on file  . Highest education level: Not on file  Occupational History  . Not on file  Social Needs  . Financial resource strain: Not on file  . Food insecurity    Worry: Not on file    Inability: Not on file  . Transportation needs    Medical: Not on file    Non-medical: Not on file  Tobacco Use  . Smoking status: Current Every Day Smoker  . Smokeless tobacco: Never Used  Substance and Sexual Activity  . Alcohol use: No    Frequency: Never  . Drug use: No  . Sexual activity: Not on file  Lifestyle  . Physical activity    Days per week: Not on file    Minutes per session: Not on file  . Stress: Not on file  Relationships  . Social Musicianconnections    Talks on phone: Not on file    Gets together: Not on file    Attends religious service: Not on file    Active member of club or organization: Not on file    Attends meetings of clubs or organizations: Not on file    Relationship status: Not on file  . Intimate partner violence    Fear of current or  ex partner: Not on file    Emotionally abused: Not on file    Physically abused: Not on file    Forced sexual activity: Not on file  Other Topics Concern  . Not on file  Social History Narrative  . Not on file      Review of Systems  Constitutional: Negative for activity change, chills, fatigue and unexpected weight change.       No weight gain or weight loss since her last visit .  HENT: Negative for congestion, postnasal drip, rhinorrhea, sneezing and sore throat.   Respiratory: Negative for cough, chest tightness, shortness of breath and wheezing.   Cardiovascular: Negative for chest pain and palpitations.       Blood pressure improved since her last visit   Gastrointestinal: Negative for abdominal pain, constipation, diarrhea, nausea and vomiting.  Endocrine: Negative for cold intolerance, heat intolerance, polydipsia and polyuria.       Blood sugars elevated since discontinuation of metformin due to national recall.   Musculoskeletal: Negative for arthralgias, back pain, joint swelling and neck pain.  Skin: Negative for rash.  Neurological: Positive for headaches. Negative for dizziness, tremors and numbness.  Hematological: Negative for adenopathy. Does not bruise/bleed easily.  Psychiatric/Behavioral: Negative for behavioral problems (Depression), sleep disturbance and suicidal ideas. The patient is nervous/anxious.     Today's Vitals   04/16/19 1441  BP: (!) 143/78  Pulse: 87  Resp: 16  SpO2: 99%  Weight: 255 lb 9.6 oz (115.9 kg)  Height: 5\' 7"  (1.702 m)   Body mass index is 40.03 kg/m.  Physical Exam Vitals signs and nursing note reviewed.  Constitutional:      General: She is not in acute distress.    Appearance: Normal appearance. She is well-developed. She is not diaphoretic.  HENT:     Head: Normocephalic and atraumatic.     Mouth/Throat:     Pharynx: No oropharyngeal exudate.  Eyes:     Pupils: Pupils are equal, round, and reactive to light.  Neck:      Musculoskeletal: Normal range of motion and neck supple.     Thyroid: No thyromegaly.     Vascular: No JVD.     Trachea: No tracheal deviation.  Cardiovascular:     Rate and Rhythm: Normal rate and regular rhythm.     Heart sounds: Normal heart sounds. No murmur. No friction rub. No gallop.   Pulmonary:     Effort: Pulmonary effort is normal. No respiratory distress.     Breath sounds: Normal breath sounds. No wheezing or rales.  Chest:     Chest wall: No tenderness.  Abdominal:     General: Bowel sounds are normal.     Palpations: Abdomen is soft.  Musculoskeletal: Normal range of motion.  Lymphadenopathy:     Cervical: No cervical adenopathy.  Skin:    General: Skin is warm and dry.     Capillary Refill: Capillary refill takes less than 2 seconds.  Neurological:     General: No focal deficit present.     Mental Status: She is alert and oriented to person, place, and time.     Cranial Nerves: No cranial nerve deficit.  Psychiatric:        Behavior: Behavior normal.        Thought Content: Thought content normal.        Judgment: Judgment normal.   Assessment/Plan: 1. Type 2 diabetes mellitus with hyperglycemia, unspecified whether long term insulin use (HCC) Reorder metformin XR 500mg , taking two tablets twice daily. If back in stock, will go back to Actos 15mg , only one tablet daily. Monitor blood sugars closely.  - metFORMIN (GLUCOPHAGE XR) 500 MG 24 hr tablet; Take 2 tablets po BID  Dispense: 120 tablet; Refill: 5  2. Essential hypertension Stable. Continue bp medication as prescribed   3. BMI 40.0-44.9, adult (HCC) D/c phentermine due to severity of side effects. Discussed diet and lifestyle changes needed to help with weight loss.   General Counseling: cornisha zetino understanding of the findings of todays visit and agrees with plan of treatment. I have discussed any further diagnostic evaluation that may be needed or ordered today. We also reviewed her medications  today. she has been encouraged to call the office with any questions or concerns that should arise related to todays visit.  Diabetes Counseling:  1. Addition of ACE inh/ ARB'S for nephroprotection. Microalbumin is updated  2. Diabetic foot care, prevention of complications. Podiatry consult 3. Exercise and lose weight.  4. Diabetic eye examination, Diabetic eye exam is updated  5. Monitor blood sugar closlely. nutrition counseling.  6. Sign and symptoms of hypoglycemia including shaking sweating,confusion and headaches.   There is a liability release in patients' chart. There has been a 10 minute discussion about the side effects including but not limited to elevated blood pressure, anxiety, lack of sleep and dry mouth. Pt understands and will like to start/continue on appetite suppressant at this time. There will be one month RX given at the time of visit with proper follow up. Nova diet plan with restricted calories is given to the pt. Pt understands and agrees with  plan of treatment  This patient was seen by Leretha Pol FNP Collaboration with Dr Lavera Guise as a part of collaborative care agreement  Meds ordered this encounter  Medications  . metFORMIN (GLUCOPHAGE XR) 500 MG 24 hr tablet    Sig: Take 2 tablets po BID    Dispense:  120 tablet    Refill:  5    Order Specific Question:   Supervising Provider    Answer:   Lavera Guise [[id number]]    Time spent: 69 Minutes      Dr Lavera Guise Internal medicine

## 2019-04-17 ENCOUNTER — Telehealth: Payer: Self-pay

## 2019-04-17 NOTE — Telephone Encounter (Signed)
[place]. Can you find out from the pharmacy if the metformin 1000 ER has also been recalled? thanks

## 2019-04-18 ENCOUNTER — Other Ambulatory Visit: Payer: Self-pay | Admitting: Nurse Practitioner

## 2019-04-18 DIAGNOSIS — E1165 Type 2 diabetes mellitus with hyperglycemia: Secondary | ICD-10-CM

## 2019-04-18 MED ORDER — METFORMIN HCL ER (MOD) 1000 MG PO TB24: ORAL_TABLET | ORAL | 3 refills | Status: DC

## 2019-04-18 NOTE — Telephone Encounter (Signed)
Changed metformine ER 500mg  to metformine ER 1000mg  twice daily due to national recall on metformin ER 500mg  tablets.  I sent new prescription to [facility]

## 2019-04-18 NOTE — Progress Notes (Signed)
Changed metformine ER 500mg  to metformine ER 1000mg  twice daily due to national recall on metformin ER 500mg  tablets.

## 2019-06-19 ENCOUNTER — Other Ambulatory Visit: Payer: Self-pay

## 2019-06-19 ENCOUNTER — Encounter: Payer: Self-pay | Admitting: Nurse Practitioner

## 2019-06-19 ENCOUNTER — Ambulatory Visit: Payer: Medicaid Other | Admitting: Nurse Practitioner

## 2019-06-19 VITALS — BP 119/67 | HR 95 | Resp 16 | Ht 66.0 in | Wt 243.0 lb

## 2019-06-19 DIAGNOSIS — Z794 Long term (current) use of insulin: Secondary | ICD-10-CM | POA: Diagnosis not present

## 2019-06-19 DIAGNOSIS — I10 Essential (primary) hypertension: Secondary | ICD-10-CM | POA: Diagnosis not present

## 2019-06-19 DIAGNOSIS — E1165 Type 2 diabetes mellitus with hyperglycemia: Secondary | ICD-10-CM | POA: Diagnosis not present

## 2019-06-19 LAB — POCT GLYCOSYLATED HEMOGLOBIN (HGB A1C): Hemoglobin A1C: 7.5 % — AB (ref 4.0–5.6)

## 2019-06-19 NOTE — Progress Notes (Signed)
[facility] [address] [place], [place] [id number]  Internal MEDICINE  Office Visit Note  Patient Name: Vicki Simmons  [id number]  [id number]  Date of Service: 06/19/2019  Chief Complaint  Patient presents with  . Follow-up  . Hypertension  . Diabetes    The patient is here for follow up for diabetes. She has stopped taking actos completely. It was contributing to weight gain. Taking her Insulin N 7 units in the evenings, not taking morning doses in some time. Still taking glyburide and metformin twice daily. Her blood sugars are stable. Her HgbA1c 7.5 today. Her blood pressure is well controlled. She has lost 12 pounds since she was last seen. She has no concerns or complaints today.       Current Medication: Outpatient Encounter Medications as of 06/19/2019  Medication Sig  . ALPRAZolam (XANAX) 0.5 MG tablet Take 1 tablet (0.5 mg total) by mouth 2 (two) times daily as needed for anxiety.  . butalbital-acetaminophen-caffeine (FIORICET, ESGIC) 50-325-40 MG tablet Take 1-2 tablets by mouth every 6 (six) hours as needed.  . cyclobenzaprine (FLEXERIL) 10 MG tablet Take 1 tablet (10 mg total) by mouth at bedtime as needed for muscle spasms.  Marland Kitchen. glimepiride (AMARYL) 4 MG tablet Take 1 tablet (4 mg total) by mouth 2 (two) times daily.  . hydrochlorothiazide (HYDRODIURIL) 12.5 MG tablet Take 1 tablet (12.5 mg total) by mouth daily.  . insulin NPH Human (HUMULIN N,NOVOLIN N) 100 UNIT/ML injection Inject 60units [place] QAM and 70 units [place] QPM  . lisinopril (ZESTRIL) 40 MG tablet Take 1 tablet po QD  . metFORMIN (GLUMETZA) 1000 MG (MOD) 24 hr tablet Take 1 tablet po bid  . naproxen (NAPROSYN) 500 MG tablet Take 1 tablet (500 mg total) by mouth 2 (two) times daily with a meal.  . potassium chloride (K-DUR) 10 MEQ tablet Take 1 tablet (10 mEq total) by mouth daily.  . traMADol (ULTRAM) 50 MG tablet Take 1 tablet (50 mg total) by mouth every 12 (twelve) hours as needed.  .  [DISCONTINUED] pioglitazone (ACTOS) 15 MG tablet Take 2 tablets (30 mg total) by mouth daily. (Patient not taking: Reported on 06/19/2019)   No facility-administered encounter medications on file as of 06/19/2019.     Surgical History: Past Surgical History:  Procedure Laterality Date  . CESAREAN SECTION    . TUBAL LIGATION      Medical History: Past Medical History:  Diagnosis Date  . Diabetes (HCC)   . Hypertension     Family History: Family History  Problem Relation Age of Onset  . Diabetes Mother   . Hyperlipidemia Mother   . Hypertension Mother   . Heart attack Father   . Stroke Maternal Grandmother     Social History   Socioeconomic History  . Marital status: Married    Spouse name: Not on file  . Number of children: Not on file  . Years of education: Not on file  . Highest education level: Not on file  Occupational History  . Not on file  Social Needs  . Financial resource strain: Not on file  . Food insecurity    Worry: Not on file    Inability: Not on file  . Transportation needs    Medical: Not on file    Non-medical: Not on file  Tobacco Use  . Smoking status: Current Every Day Smoker  . Smokeless tobacco: Never Used  Substance and Sexual Activity  . Alcohol use: No  Frequency: Never  . Drug use: No  . Sexual activity: Not on file  Lifestyle  . Physical activity    Days per week: Not on file    Minutes per session: Not on file  . Stress: Not on file  Relationships  . Social Musician on phone: Not on file    Gets together: Not on file    Attends religious service: Not on file    Active member of club or organization: Not on file    Attends meetings of clubs or organizations: Not on file    Relationship status: Not on file  . Intimate partner violence    Fear of current or ex partner: Not on file    Emotionally abused: Not on file    Physically abused: Not on file    Forced sexual activity: Not on file  Other Topics Concern   . Not on file  Social History Narrative  . Not on file      Review of Systems  Constitutional: Negative for activity change, chills, fatigue and unexpected weight change.       Twelve pound weight loss since last visit  HENT: Negative for congestion, postnasal drip, rhinorrhea, sneezing and sore throat.   Respiratory: Negative for apnea, cough, chest tightness, shortness of breath and wheezing.   Cardiovascular: Negative for chest pain and palpitations.       Blood pressure improved since her last visit   Gastrointestinal: Negative for abdominal pain, constipation, diarrhea, nausea and vomiting.  Endocrine: Negative for cold intolerance, heat intolerance, polydipsia and polyuria.       Blood sugars stable.   Musculoskeletal: Negative for arthralgias, back pain, joint swelling and neck pain.  Skin: Negative for rash.  Neurological: Positive for headaches. Negative for dizziness, tremors and numbness.  Hematological: Negative for adenopathy. Does not bruise/bleed easily.  Psychiatric/Behavioral: Negative for behavioral problems (Depression), sleep disturbance and suicidal ideas. The patient is nervous/anxious.     Today's Vitals   06/19/19 1348  BP: 119/67  Pulse: 95  Resp: 16  SpO2: 97%  Weight: 243 lb (110.2 kg)  Height: 5\' 6"  (1.676 m)   Body mass index is 39.22 kg/m.  Physical Exam Vitals signs and nursing note reviewed.  Constitutional:      General: She is not in acute distress.    Appearance: Normal appearance. She is well-developed. She is not diaphoretic.  HENT:     Head: Normocephalic and atraumatic.     Mouth/Throat:     Pharynx: No oropharyngeal exudate.  Eyes:     Pupils: Pupils are equal, round, and reactive to light.  Neck:     Musculoskeletal: Normal range of motion and neck supple.     Thyroid: No thyromegaly.     Vascular: No JVD.     Trachea: No tracheal deviation.  Cardiovascular:     Rate and Rhythm: Normal rate and regular rhythm.     Heart  sounds: Normal heart sounds. No murmur. No friction rub. No gallop.   Pulmonary:     Effort: Pulmonary effort is normal. No respiratory distress.     Breath sounds: Normal breath sounds. No wheezing or rales.  Chest:     Chest wall: No tenderness.  Abdominal:     Palpations: Abdomen is soft.  Musculoskeletal: Normal range of motion.  Lymphadenopathy:     Cervical: No cervical adenopathy.  Skin:    General: Skin is warm and dry.     Capillary Refill:  Capillary refill takes less than 2 seconds.  Neurological:     General: No focal deficit present.     Mental Status: She is alert and oriented to person, place, and time.     Cranial Nerves: No cranial nerve deficit.  Psychiatric:        Behavior: Behavior normal.        Thought Content: Thought content normal.        Judgment: Judgment normal.   Assessment/Plan: 1. Type 2 diabetes mellitus with hyperglycemia, with long-term current use of insulin (HCC) - POCT HgB A1C 7.5. stable since last visit. Will continue off actos. Encouraged her to continue insulin as prescribed along with glyburide and metformin. Recheck in three months.   2. Essential hypertension, benign Well controlled. Continue medications as prescribed. Continue with weight loss through diet and exercise.   General Counseling: bettyjo lundblad understanding of the findings of todays visit and agrees with plan of treatment. I have discussed any further diagnostic evaluation that may be needed or ordered today. We also reviewed her medications today. she has been encouraged to call the office with any questions or concerns that should arise related to todays visit.   Diabetes Counseling:  1. Addition of ACE inh/ ARB'S for nephroprotection. Microalbumin is updated  2. Diabetic foot care, prevention of complications. Podiatry consult 3. Exercise and lose weight.  4. Diabetic eye examination, Diabetic eye exam is updated  5. Monitor blood sugar closlely. nutrition counseling.   6. Sign and symptoms of hypoglycemia including shaking sweating,confusion and headaches.  This patient was seen by Leretha Pol FNP Collaboration with Dr Lavera Guise as a part of collaborative care agreement  Orders Placed This Encounter  Procedures  . POCT HgB A1C    Time spent: 15 Minutes      Dr Lavera Guise Internal medicine

## 2019-06-21 ENCOUNTER — Ambulatory Visit: Payer: Medicaid Other | Admitting: Nurse Practitioner

## 2019-07-30 ENCOUNTER — Other Ambulatory Visit: Payer: Self-pay

## 2019-07-30 MED ORDER — GLIMEPIRIDE 4 MG PO TABS: 4.0000 mg | ORAL_TABLET | Freq: Two times a day (BID) | ORAL | 5 refills | Status: DC

## 2019-09-18 ENCOUNTER — Telehealth: Payer: Self-pay

## 2019-09-18 NOTE — Telephone Encounter (Signed)
LMOM FOR PATIENT TO CONFIRM AND SCREEN FOR COVID FOR 09-24-19 OV. °

## 2019-09-24 ENCOUNTER — Encounter: Payer: Self-pay | Admitting: Nurse Practitioner

## 2019-09-24 ENCOUNTER — Ambulatory Visit: Payer: Self-pay | Admitting: Nurse Practitioner

## 2019-09-24 ENCOUNTER — Other Ambulatory Visit: Payer: Self-pay

## 2019-09-24 VITALS — BP 129/69 | HR 78 | Resp 16 | Ht 66.0 in | Wt 233.2 lb

## 2019-09-24 DIAGNOSIS — I10 Essential (primary) hypertension: Secondary | ICD-10-CM

## 2019-09-24 DIAGNOSIS — E1165 Type 2 diabetes mellitus with hyperglycemia: Secondary | ICD-10-CM

## 2019-09-24 DIAGNOSIS — F411 Generalized anxiety disorder: Secondary | ICD-10-CM

## 2019-09-24 DIAGNOSIS — Z794 Long term (current) use of insulin: Secondary | ICD-10-CM

## 2019-09-24 LAB — POCT GLYCOSYLATED HEMOGLOBIN (HGB A1C): Hemoglobin A1C: 6.9 % — AB (ref 4.0–5.6)

## 2019-09-24 NOTE — Progress Notes (Signed)
[facility] [address] [place], [place] [address]  Internal MEDICINE  Office Visit Note  Patient Name: Vicki Simmons  [id number]  [id number]  Date of Service: 09/24/2019  Chief Complaint  Patient presents with  . Diabetes  . Hypertension  . Quality Metric Gaps    pna vacc    The patient is here for follow up visit. Blood sugars doing very well. HgbA1c 6.9 today. Best it has been in years. Blood pressure is well managed. She has lost 10 pounds since she was last seen. She is feeling well and has no concerns or complaints.       Current Medication: Outpatient Encounter Medications as of 09/24/2019  Medication Sig  . ALPRAZolam (XANAX) 0.5 MG tablet Take 1 tablet (0.5 mg total) by mouth 2 (two) times daily as needed for anxiety.  . butalbital-acetaminophen-caffeine (FIORICET, ESGIC) 50-325-40 MG tablet Take 1-2 tablets by mouth every 6 (six) hours as needed.  . cyclobenzaprine (FLEXERIL) 10 MG tablet Take 1 tablet (10 mg total) by mouth at bedtime as needed for muscle spasms.  Marland Kitchen glimepiride (AMARYL) 4 MG tablet Take 1 tablet (4 mg total) by mouth 2 (two) times daily.  . hydrochlorothiazide (HYDRODIURIL) 12.5 MG tablet Take 1 tablet (12.5 mg total) by mouth daily.  . insulin NPH Human (HUMULIN N,NOVOLIN N) 100 UNIT/ML injection Inject 60units [place] QAM and 70 units [place] QPM  . lisinopril (ZESTRIL) 40 MG tablet Take 1 tablet po QD  . metFORMIN (GLUMETZA) 1000 MG (MOD) 24 hr tablet Take 1 tablet po bid  . naproxen (NAPROSYN) 500 MG tablet Take 1 tablet (500 mg total) by mouth 2 (two) times daily with a meal.  . potassium chloride (K-DUR) 10 MEQ tablet Take 1 tablet (10 mEq total) by mouth daily.  . traMADol (ULTRAM) 50 MG tablet Take 1 tablet (50 mg total) by mouth every 12 (twelve) hours as needed.   No facility-administered encounter medications on file as of 09/24/2019.     Surgical History: Past Surgical History:  Procedure Laterality Date  . CESAREAN SECTION     . TUBAL LIGATION      Medical History: Past Medical History:  Diagnosis Date  . Diabetes (HCC)   . Hypertension     Family History: Family History  Problem Relation Age of Onset  . Diabetes Mother   . Hyperlipidemia Mother   . Hypertension Mother   . Heart attack Father   . Stroke Maternal Grandmother     Social History   Socioeconomic History  . Marital status: Married    Spouse name: Not on file  . Number of children: Not on file  . Years of education: Not on file  . Highest education level: Not on file  Occupational History  . Not on file  Social Needs  . Financial resource strain: Not on file  . Food insecurity    Worry: Not on file    Inability: Not on file  . Transportation needs    Medical: Not on file    Non-medical: Not on file  Tobacco Use  . Smoking status: Current Every Day Smoker    Types: Cigarettes  . Smokeless tobacco: Never Used  Substance and Sexual Activity  . Alcohol use: No    Frequency: Never  . Drug use: No  . Sexual activity: Not on file  Lifestyle  . Physical activity    Days per week: Not on file    Minutes per session: Not on file  .  Stress: Not on file  Relationships  . Social Herbalist on phone: Not on file    Gets together: Not on file    Attends religious service: Not on file    Active member of club or organization: Not on file    Attends meetings of clubs or organizations: Not on file    Relationship status: Not on file  . Intimate partner violence    Fear of current or ex partner: Not on file    Emotionally abused: Not on file    Physically abused: Not on file    Forced sexual activity: Not on file  Other Topics Concern  . Not on file  Social History Narrative  . Not on file      Review of Systems  Constitutional: Negative for activity change, chills, fatigue and unexpected weight change.       Ten pound weight loss since last visit  HENT: Negative for congestion, postnasal drip, rhinorrhea,  sneezing and sore throat.   Respiratory: Negative for apnea, cough, chest tightness, shortness of breath and wheezing.   Cardiovascular: Negative for chest pain and palpitations.       Blood pressures good .  Gastrointestinal: Negative for abdominal pain, constipation, diarrhea, nausea and vomiting.  Endocrine: Negative for cold intolerance, heat intolerance, polydipsia and polyuria.       Blood sugars stable and improved since her last visit   Musculoskeletal: Negative for arthralgias, back pain, joint swelling and neck pain.  Skin: Negative for rash.  Neurological: Positive for headaches. Negative for dizziness, tremors and numbness.  Hematological: Negative for adenopathy. Does not bruise/bleed easily.  Psychiatric/Behavioral: Negative for behavioral problems (Depression), sleep disturbance and suicidal ideas. The patient is nervous/anxious.     Today's Vitals   09/24/19 1344  BP: 129/69  Pulse: 78  Resp: 16  SpO2: 98%  Weight: 233 lb 3.2 oz (105.8 kg)  Height: 5\' 6"  (1.676 m)   Body mass index is 37.64 kg/m.  Physical Exam Vitals signs and nursing note reviewed.  Constitutional:      General: She is not in acute distress.    Appearance: Normal appearance. She is well-developed. She is not diaphoretic.  HENT:     Head: Normocephalic and atraumatic.     Mouth/Throat:     Pharynx: No oropharyngeal exudate.  Eyes:     Pupils: Pupils are equal, round, and reactive to light.  Neck:     Musculoskeletal: Normal range of motion and neck supple.     Thyroid: No thyromegaly.     Vascular: No JVD.     Trachea: No tracheal deviation.  Cardiovascular:     Rate and Rhythm: Normal rate and regular rhythm.     Heart sounds: Normal heart sounds. No murmur. No friction rub. No gallop.   Pulmonary:     Effort: Pulmonary effort is normal. No respiratory distress.     Breath sounds: Normal breath sounds. No wheezing or rales.  Chest:     Chest wall: No tenderness.  Abdominal:      Palpations: Abdomen is soft.  Musculoskeletal: Normal range of motion.  Lymphadenopathy:     Cervical: No cervical adenopathy.  Skin:    General: Skin is warm and dry.     Capillary Refill: Capillary refill takes less than 2 seconds.  Neurological:     General: No focal deficit present.     Mental Status: She is alert and oriented to person, place, and time.  Cranial Nerves: No cranial nerve deficit.  Psychiatric:        Behavior: Behavior normal.        Thought Content: Thought content normal.        Judgment: Judgment normal.    Assessment/Plan: 1. Type 2 diabetes mellitus with hyperglycemia, with long-term current use of insulin (HCC) - POCT HgB A1C 6.9 today. Continue diabetic medication as prescribed. Monitor blood sugars closely.   2. Essential hypertension, benign Stable. Continue bp medication as prescribed   3. Generalized anxiety disorder Improved. Not taking alprazolam at this time. Will keep on her med list and fill if needed.   General Counseling: [name] verbalizes understanding of the findings of todays visit and agrees with plan of treatment. I have discussed any further diagnostic evaluation that may be needed or ordered today. We also reviewed her medications today. she has been encouraged to call the office with any questions or concerns that should arise related to todays visit.    Orders Placed This Encounter  Procedures  . POCT HgB A1C   Diabetes Counseling:  1. Addition of ACE inh/ ARB'S for nephroprotection. Microalbumin is updated  2. Diabetic foot care, prevention of complications. Podiatry consult 3. Exercise and lose weight.  4. Diabetic eye examination, Diabetic eye exam is updated  5. Monitor blood sugar closlely. nutrition counseling.  6. Sign and symptoms of hypoglycemia including shaking sweating,confusion and headaches.  This patient was seen by Vincent GrosHeather Chinara Hertzberg FNP Collaboration with Dr Lyndon CodeFozia M Khan as a part of collaborative care  agreement  Time spent: 25 Minutes      Dr Lyndon CodeFozia M Khan Internal medicine

## 2019-10-18 ENCOUNTER — Other Ambulatory Visit: Payer: Self-pay | Admitting: Nurse Practitioner

## 2019-10-18 DIAGNOSIS — E1165 Type 2 diabetes mellitus with hyperglycemia: Secondary | ICD-10-CM

## 2019-12-20 ENCOUNTER — Telehealth: Payer: Self-pay

## 2019-12-20 NOTE — Telephone Encounter (Signed)
LMOM FOR PATIENT TO SCREEN AND CONFIRM FOR 12-24-19 OV.

## 2019-12-24 ENCOUNTER — Ambulatory Visit: Payer: Self-pay | Admitting: Nurse Practitioner

## 2019-12-24 ENCOUNTER — Encounter: Payer: Self-pay | Admitting: Nurse Practitioner

## 2019-12-24 ENCOUNTER — Other Ambulatory Visit: Payer: Self-pay

## 2019-12-24 VITALS — BP 130/70 | HR 82 | Temp 97.4°F | Resp 16 | Ht 66.0 in | Wt 231.2 lb

## 2019-12-24 DIAGNOSIS — I10 Essential (primary) hypertension: Secondary | ICD-10-CM

## 2019-12-24 DIAGNOSIS — E1165 Type 2 diabetes mellitus with hyperglycemia: Secondary | ICD-10-CM

## 2019-12-24 DIAGNOSIS — F411 Generalized anxiety disorder: Secondary | ICD-10-CM

## 2019-12-24 DIAGNOSIS — Z794 Long term (current) use of insulin: Secondary | ICD-10-CM

## 2019-12-24 DIAGNOSIS — E876 Hypokalemia: Secondary | ICD-10-CM

## 2019-12-24 LAB — POCT GLYCOSYLATED HEMOGLOBIN (HGB A1C): Hemoglobin A1C: 7.1 % — AB (ref 4.0–5.6)

## 2019-12-24 MED ORDER — ALPRAZOLAM 0.5 MG PO TABS: 0.5000 mg | ORAL_TABLET | Freq: Two times a day (BID) | ORAL | 2 refills | Status: DC | PRN

## 2019-12-24 MED ORDER — POTASSIUM CHLORIDE ER 10 MEQ PO TBCR: 10.0000 meq | EXTENDED_RELEASE_TABLET | Freq: Every day | ORAL | 3 refills | Status: DC

## 2019-12-24 MED ORDER — METFORMIN HCL ER 500 MG PO TB24: ORAL_TABLET | ORAL | 3 refills | Status: DC

## 2019-12-24 MED ORDER — HYDROCHLOROTHIAZIDE 12.5 MG PO TABS: 12.5000 mg | ORAL_TABLET | Freq: Every day | ORAL | 3 refills | Status: DC

## 2019-12-24 MED ORDER — LISINOPRIL 40 MG PO TABS: ORAL_TABLET | ORAL | 3 refills | Status: DC

## 2019-12-24 MED ORDER — GLIMEPIRIDE 4 MG PO TABS: 4.0000 mg | ORAL_TABLET | Freq: Two times a day (BID) | ORAL | 3 refills | Status: DC

## 2019-12-24 NOTE — Progress Notes (Signed)
[facility] [address] [place], [place] [address]  Internal MEDICINE  Office Visit Note  Patient Name: Vicki Simmons  [id number]  [id number]  Date of Service: 12/24/2019  Chief Complaint  Patient presents with  . Diabetes  . Hypertension    The patient is here for follow up visit. Blood sugars doing very well. HgbA1c 7.1, up slightly from last visit. She is feeling well. Blood pressure is well controlled. She has lost additional 2 pounds since her last visit. She does need refills of all routine medications.       Current Medication: Outpatient Encounter Medications as of 12/24/2019  Medication Sig  . ALPRAZolam (XANAX) 0.5 MG tablet Take 1 tablet (0.5 mg total) by mouth 2 (two) times daily as needed for anxiety.  . cyclobenzaprine (FLEXERIL) 10 MG tablet Take 1 tablet (10 mg total) by mouth at bedtime as needed for muscle spasms.  Marland Kitchen glimepiride (AMARYL) 4 MG tablet Take 1 tablet (4 mg total) by mouth 2 (two) times daily.  . hydrochlorothiazide (HYDRODIURIL) 12.5 MG tablet Take 1 tablet (12.5 mg total) by mouth daily.  . insulin NPH Human (HUMULIN N,NOVOLIN N) 100 UNIT/ML injection Inject 60units [place] QAM and 70 units [place] QPM  . lisinopril (ZESTRIL) 40 MG tablet Take 1 tablet po QD  . metFORMIN (GLUCOPHAGE XR) 500 MG 24 hr tablet TAKE 2 TABLETS BY MOUTH TWICE DAILY  . naproxen (NAPROSYN) 500 MG tablet Take 1 tablet (500 mg total) by mouth 2 (two) times daily with a meal.  . potassium chloride (KLOR-CON) 10 MEQ tablet Take 1 tablet (10 mEq total) by mouth daily.  . traMADol (ULTRAM) 50 MG tablet Take 1 tablet (50 mg total) by mouth every 12 (twelve) hours as needed.  . [DISCONTINUED] ALPRAZolam (XANAX) 0.5 MG tablet Take 1 tablet (0.5 mg total) by mouth 2 (two) times daily as needed for anxiety.  . [DISCONTINUED] glimepiride (AMARYL) 4 MG tablet Take 1 tablet (4 mg total) by mouth 2 (two) times daily.  . [DISCONTINUED] hydrochlorothiazide (HYDRODIURIL) 12.5 MG tablet  Take 1 tablet (12.5 mg total) by mouth daily.  . [DISCONTINUED] lisinopril (ZESTRIL) 40 MG tablet Take 1 tablet po QD  . [DISCONTINUED] metFORMIN (GLUCOPHAGE XR) 500 MG 24 hr tablet TAKE 2 TABLETS BY MOUTH TWICE DAILY  . [DISCONTINUED] potassium chloride (K-DUR) 10 MEQ tablet Take 1 tablet (10 mEq total) by mouth daily.   No facility-administered encounter medications on file as of 12/24/2019.    Surgical History: Past Surgical History:  Procedure Laterality Date  . CESAREAN SECTION    . TUBAL LIGATION      Medical History: Past Medical History:  Diagnosis Date  . Diabetes (HCC)   . Hypertension     Family History: Family History  Problem Relation Age of Onset  . Diabetes Mother   . Hyperlipidemia Mother   . Hypertension Mother   . Heart attack Father   . Stroke Maternal Grandmother     Social History   Socioeconomic History  . Marital status: Married    Spouse name: Not on file  . Number of children: Not on file  . Years of education: Not on file  . Highest education level: Not on file  Occupational History  . Not on file  Tobacco Use  . Smoking status: Current Every Day Smoker    Types: Cigarettes  . Smokeless tobacco: Never Used  Substance and Sexual Activity  . Alcohol use: No  . Drug use: No  .  Sexual activity: Not on file  Other Topics Concern  . Not on file  Social History Narrative  . Not on file   Social Determinants of Health   Financial Resource Strain:   . Difficulty of Paying Living Expenses: Not on file  Food Insecurity:   . Worried About Programme researcher, broadcasting/film/video in the Last Year: Not on file  . Ran Out of Food in the Last Year: Not on file  Transportation Needs:   . Lack of Transportation (Medical): Not on file  . Lack of Transportation (Non-Medical): Not on file  Physical Activity:   . Days of Exercise per Week: Not on file  . Minutes of Exercise per Session: Not on file  Stress:   . Feeling of Stress : Not on file  Social Connections:    . Frequency of Communication with Friends and Family: Not on file  . Frequency of Social Gatherings with Friends and Family: Not on file  . Attends Religious Services: Not on file  . Active Member of Clubs or Organizations: Not on file  . Attends Banker Meetings: Not on file  . Marital Status: Not on file  Intimate Partner Violence:   . Fear of Current or Ex-Partner: Not on file  . Emotionally Abused: Not on file  . Physically Abused: Not on file  . Sexually Abused: Not on file      Review of Systems  Constitutional: Negative for chills, fatigue and unexpected weight change.       Two pound weight loss since her last visit.   HENT: Negative for congestion, postnasal drip, rhinorrhea, sneezing and sore throat.   Respiratory: Negative for cough, chest tightness and shortness of breath.   Cardiovascular: Negative for chest pain and palpitations.  Gastrointestinal: Negative for abdominal pain, constipation, diarrhea, nausea and vomiting.  Endocrine: Negative for cold intolerance, heat intolerance, polydipsia and polyuria.       Blood sugars doing well   Musculoskeletal: Negative for arthralgias, back pain, joint swelling and neck pain.  Skin: Negative for rash.  Allergic/Immunologic: Negative for environmental allergies.  Neurological: Negative for dizziness, tremors, numbness and headaches.  Hematological: Negative for adenopathy. Does not bruise/bleed easily.  Psychiatric/Behavioral: Negative for behavioral problems (Depression), sleep disturbance and suicidal ideas. The patient is nervous/anxious.     Today's Vitals   12/24/19 0941  BP: 130/70  Pulse: 82  Resp: 16  Temp: (!) 97.4 F (36.3 C)  SpO2: 98%  Weight: 231 lb 3.2 oz (104.9 kg)  Height: 5\' 6"  (1.676 m)   Body mass index is 37.32 kg/m.   Physical Exam Vitals and nursing note reviewed.  Constitutional:      General: She is not in acute distress.    Appearance: Normal appearance. She is  well-developed. She is not diaphoretic.  HENT:     Head: Normocephalic and atraumatic.     Mouth/Throat:     Pharynx: No oropharyngeal exudate.  Eyes:     Pupils: Pupils are equal, round, and reactive to light.  Neck:     Thyroid: No thyromegaly.     Vascular: No JVD.     Trachea: No tracheal deviation.  Cardiovascular:     Rate and Rhythm: Normal rate and regular rhythm.     Heart sounds: Normal heart sounds. No murmur. No friction rub. No gallop.   Pulmonary:     Effort: Pulmonary effort is normal. No respiratory distress.     Breath sounds: Normal breath sounds. No wheezing or  rales.  Chest:     Chest wall: No tenderness.  Abdominal:     Palpations: Abdomen is soft.  Musculoskeletal:        General: Normal range of motion.     Cervical back: Normal range of motion and neck supple.  Lymphadenopathy:     Cervical: No cervical adenopathy.  Skin:    General: Skin is warm and dry.     Capillary Refill: Capillary refill takes less than 2 seconds.  Neurological:     General: No focal deficit present.     Mental Status: She is alert and oriented to person, place, and time.     Cranial Nerves: No cranial nerve deficit.  Psychiatric:        Behavior: Behavior normal.        Thought Content: Thought content normal.        Judgment: Judgment normal.    Assessment/Plan:  1. Type 2 diabetes mellitus with hyperglycemia, with long-term current use of insulin (HCC) - POCT HgB A1C 7.1 today. Continue all diabetic medication as prescribed. Refilled amaryl and metformin today - glimepiride (AMARYL) 4 MG tablet; Take 1 tablet (4 mg total) by mouth 2 (two) times daily.  Dispense: 180 tablet; Refill: 3 - metFORMIN (GLUCOPHAGE XR) 500 MG 24 hr tablet; TAKE 2 TABLETS BY MOUTH TWICE DAILY  Dispense: 360 tablet; Refill: 3  2. Essential hypertension, benign Stable. Continue bp medication as prescribed  - hydrochlorothiazide (HYDRODIURIL) 12.5 MG tablet; Take 1 tablet (12.5 mg total) by mouth  daily.  Dispense: 90 tablet; Refill: 3 - lisinopril (ZESTRIL) 40 MG tablet; Take 1 tablet po QD  Dispense: 90 tablet; Refill: 3  3. Hypokalemia Continue potassium as prescribed  - potassium chloride (KLOR-CON) 10 MEQ tablet; Take 1 tablet (10 mEq total) by mouth daily.  Dispense: 90 tablet; Refill: 3  4. Generalized anxiety disorder May take alprazolam 0.5mg  up to twice daily as needed for acute anxiety.  - ALPRAZolam (XANAX) 0.5 MG tablet; Take 1 tablet (0.5 mg total) by mouth 2 (two) times daily as needed for anxiety.  Dispense: 45 tablet; Refill: 2  General Counseling: Santita verbalizes understanding of the findings of todays visit and agrees with plan of treatment. I have discussed any further diagnostic evaluation that may be needed or ordered today. We also reviewed her medications today. she has been encouraged to call the office with any questions or concerns that should arise related to todays visit.  Diabetes Counseling:  1. Addition of ACE inh/ ARB'S for nephroprotection. Microalbumin is updated  2. Diabetic foot care, prevention of complications. Podiatry consult 3. Exercise and lose weight.  4. Diabetic eye examination, Diabetic eye exam is updated  5. Monitor blood sugar closlely. nutrition counseling.  6. Sign and symptoms of hypoglycemia including shaking sweating,confusion and headaches.  This patient was seen by Leretha Pol FNP Collaboration with Dr Lavera Guise as a part of collaborative care agreement  Orders Placed This Encounter  Procedures  . POCT HgB A1C    Meds ordered this encounter  Medications  . ALPRAZolam (XANAX) 0.5 MG tablet    Sig: Take 1 tablet (0.5 mg total) by mouth 2 (two) times daily as needed for anxiety.    Dispense:  45 tablet    Refill:  2    Order Specific Question:   Supervising Provider    Answer:   Lavera Guise [[id number]]  . glimepiride (AMARYL) 4 MG tablet    Sig: Take 1 tablet (4 mg  total) by mouth 2 (two) times daily.    Dispense:   180 tablet    Refill:  3    Order Specific Question:   Supervising Provider    Answer:   Lyndon Code [[id number]]  . hydrochlorothiazide (HYDRODIURIL) 12.5 MG tablet    Sig: Take 1 tablet (12.5 mg total) by mouth daily.    Dispense:  90 tablet    Refill:  3    Order Specific Question:   Supervising Provider    Answer:   Lyndon Code [[id number]]  . lisinopril (ZESTRIL) 40 MG tablet    Sig: Take 1 tablet po QD    Dispense:  90 tablet    Refill:  3    Please note that patient should be taking 40mg , just one tablet daily.    Order Specific Question:   Supervising Provider    Answer:   [[id number]]  . metFORMIN (GLUCOPHAGE XR) 500 MG 24 hr tablet    Sig: TAKE 2 TABLETS BY MOUTH TWICE DAILY    Dispense:  360 tablet    Refill:  3    Order Specific Question:   Supervising Provider    Answer:   Lyndon Code [[id number]]  . potassium chloride (KLOR-CON) 10 MEQ tablet    Sig: Take 1 tablet (10 mEq total) by mouth daily.    Dispense:  90 tablet    Refill:  3    Order Specific Question:   Supervising Provider    Answer:   Lyndon Code [[id number]]    Total time spent: 30 Minutes  Time spent includes review of chart, medications, test results, and follow up plan with the patient.      Dr Lyndon Code Internal medicine

## 2020-01-17 ENCOUNTER — Other Ambulatory Visit: Payer: Self-pay | Admitting: Internal Medicine

## 2020-01-17 DIAGNOSIS — Z794 Long term (current) use of insulin: Secondary | ICD-10-CM

## 2020-01-17 DIAGNOSIS — E1165 Type 2 diabetes mellitus with hyperglycemia: Secondary | ICD-10-CM

## 2020-03-25 ENCOUNTER — Telehealth: Payer: Self-pay

## 2020-03-25 NOTE — Telephone Encounter (Signed)
Lmom to confirm and screen for 03-27-20 ov. 

## 2020-03-27 ENCOUNTER — Ambulatory Visit: Payer: Medicaid Other | Admitting: Nurse Practitioner

## 2020-04-07 ENCOUNTER — Telehealth: Payer: Self-pay

## 2020-04-07 NOTE — Telephone Encounter (Signed)
Confirmed and screened for 04-09-2020 ov. 

## 2020-04-09 ENCOUNTER — Other Ambulatory Visit: Payer: Self-pay

## 2020-04-09 ENCOUNTER — Encounter: Payer: Self-pay | Admitting: Nurse Practitioner

## 2020-04-09 ENCOUNTER — Ambulatory Visit: Payer: Self-pay | Admitting: Nurse Practitioner

## 2020-04-09 VITALS — BP 127/72 | HR 89 | Temp 97.4°F | Resp 16 | Ht 66.0 in | Wt 233.6 lb

## 2020-04-09 DIAGNOSIS — E1165 Type 2 diabetes mellitus with hyperglycemia: Secondary | ICD-10-CM

## 2020-04-09 DIAGNOSIS — I10 Essential (primary) hypertension: Secondary | ICD-10-CM

## 2020-04-09 DIAGNOSIS — Z794 Long term (current) use of insulin: Secondary | ICD-10-CM

## 2020-04-09 DIAGNOSIS — E1159 Type 2 diabetes mellitus with other circulatory complications: Secondary | ICD-10-CM

## 2020-04-09 DIAGNOSIS — F411 Generalized anxiety disorder: Secondary | ICD-10-CM

## 2020-04-09 DIAGNOSIS — E876 Hypokalemia: Secondary | ICD-10-CM

## 2020-04-09 DIAGNOSIS — Z79899 Other long term (current) drug therapy: Secondary | ICD-10-CM

## 2020-04-09 DIAGNOSIS — I152 Hypertension secondary to endocrine disorders: Secondary | ICD-10-CM

## 2020-04-09 LAB — POCT URINE DRUG SCREEN
Methylenedioxyamphetamine: NOT DETECTED
POC Amphetamine UR: NOT DETECTED
POC BENZODIAZEPINES UR: POSITIVE — AB
POC Barbiturate UR: NOT DETECTED
POC Cocaine UR: NOT DETECTED
POC Ecstasy UR: NOT DETECTED
POC Marijuana UR: NOT DETECTED
POC Methadone UR: NOT DETECTED
POC Methamphetamine UR: NOT DETECTED
POC Opiate Ur: NOT DETECTED
POC Oxycodone UR: NOT DETECTED
POC PHENCYCLIDINE UR: NOT DETECTED
POC TRICYCLICS UR: NOT DETECTED

## 2020-04-09 LAB — POCT GLYCOSYLATED HEMOGLOBIN (HGB A1C): Hemoglobin A1C: 7.4 % — AB (ref 4.0–5.6)

## 2020-04-09 MED ORDER — POTASSIUM CHLORIDE ER 10 MEQ PO TBCR: 10.0000 meq | EXTENDED_RELEASE_TABLET | Freq: Every day | ORAL | 3 refills | Status: DC

## 2020-04-09 MED ORDER — ALPRAZOLAM 0.5 MG PO TABS: 0.5000 mg | ORAL_TABLET | Freq: Two times a day (BID) | ORAL | 2 refills | Status: DC | PRN

## 2020-04-09 NOTE — Progress Notes (Signed)
[facility] [address] [place], [place] [address]  Internal MEDICINE  Office Visit Note  Patient Name: Vicki Simmons  [id number]  [id number]  Date of Service: 04/20/2020  Chief Complaint  Patient presents with   Follow-up   Diabetes   Hypertension    The patient is here for routine follow up. Blood sugars are starting to elevated just a little over past few months. Her HgbA1c is 7.4, up from 7.1 at the last check. She states that the morning blood sugars are the ones giving her the most trouble. She is currently on NPH insulin, taking 60 units in the mornings and 70 units in the evenings. She continues to take metformin and glimepiride. Her blood pressure continues to be well controlled. She does have some intermittent anxiety. She takes alprazolam 0.5mg  up to twice daily if needed. She does need to have a new prescription for this today.       Current Medication: Outpatient Encounter Medications as of 04/09/2020  Medication Sig   ALPRAZolam (XANAX) 0.5 MG tablet Take 1 tablet (0.5 mg total) by mouth 2 (two) times daily as needed for anxiety.   cyclobenzaprine (FLEXERIL) 10 MG tablet Take 1 tablet (10 mg total) by mouth at bedtime as needed for muscle spasms.   glimepiride (AMARYL) 4 MG tablet Take 1 tablet (4 mg total) by mouth 2 (two) times daily.   hydrochlorothiazide (HYDRODIURIL) 12.5 MG tablet Take 1 tablet (12.5 mg total) by mouth daily.   insulin NPH Human (HUMULIN N,NOVOLIN N) 100 UNIT/ML injection Inject 60units [place] QAM and 70 units [place] QPM   lisinopril (ZESTRIL) 40 MG tablet Take 1 tablet po QD   metFORMIN (GLUCOPHAGE-XR) 500 MG 24 hr tablet Take 2 tablets by mouth twice daily   naproxen (NAPROSYN) 500 MG tablet Take 1 tablet (500 mg total) by mouth 2 (two) times daily with a meal.   potassium chloride (KLOR-CON) 10 MEQ tablet Take 1 tablet (10 mEq total) by mouth daily.   traMADol (ULTRAM) 50 MG tablet Take 1 tablet (50 mg total) by mouth every  12 (twelve) hours as needed.   [DISCONTINUED] ALPRAZolam (XANAX) 0.5 MG tablet Take 1 tablet (0.5 mg total) by mouth 2 (two) times daily as needed for anxiety.   [DISCONTINUED] potassium chloride (KLOR-CON) 10 MEQ tablet Take 1 tablet (10 mEq total) by mouth daily.   No facility-administered encounter medications on file as of 04/09/2020.    Surgical History: Past Surgical History:  Procedure Laterality Date   CESAREAN SECTION     TUBAL LIGATION      Medical History: Past Medical History:  Diagnosis Date   Diabetes (HCC)    Hypertension     Family History: Family History  Problem Relation Age of Onset   Diabetes Mother    Hyperlipidemia Mother    Hypertension Mother    Heart attack Father    Stroke Maternal Grandmother     Social History   Socioeconomic History   Marital status: Married    Spouse name: Not on file   Number of children: Not on file   Years of education: Not on file   Highest education level: Not on file  Occupational History   Not on file  Tobacco Use   Smoking status: Current Every Day Smoker    Packs/day: 1.00    Types: Cigarettes   Smokeless tobacco: Never Used  Substance and Sexual Activity   Alcohol use: No   Drug use: No   Sexual  activity: Not on file  Other Topics Concern   Not on file  Social History Narrative   Not on file   Social Determinants of Health   Financial Resource Strain:    Difficulty of Paying Living Expenses:   Food Insecurity:    Worried About Charity fundraiser in the Last Year:    Arboriculturist in the Last Year:   Transportation Needs:    Film/video editor (Medical):    Lack of Transportation (Non-Medical):   Physical Activity:    Days of Exercise per Week:    Minutes of Exercise per Session:   Stress:    Feeling of Stress :   Social Connections:    Frequency of Communication with Friends and Family:    Frequency of Social Gatherings with Friends and Family:     Attends Religious Services:    Active Member of Clubs or Organizations:    Attends Music therapist:    Marital Status:   Intimate Partner Violence:    Fear of Current or Ex-Partner:    Emotionally Abused:    Physically Abused:    Sexually Abused:       Review of Systems  Constitutional: Negative for chills, fatigue and unexpected weight change.       Two pound weight gain since her last visit.   HENT: Negative for congestion, postnasal drip, rhinorrhea, sneezing and sore throat.   Respiratory: Negative for cough, chest tightness and shortness of breath.   Cardiovascular: Negative for chest pain and palpitations.  Gastrointestinal: Negative for abdominal pain, constipation, diarrhea, nausea and vomiting.  Endocrine: Negative for cold intolerance, heat intolerance, polydipsia and polyuria.       Blood sugars doing well   Musculoskeletal: Negative for arthralgias, back pain, joint swelling and neck pain.  Skin: Negative for rash.  Allergic/Immunologic: Negative for environmental allergies.  Neurological: Negative for dizziness, tremors, numbness and headaches.  Hematological: Negative for adenopathy. Does not bruise/bleed easily.  Psychiatric/Behavioral: Negative for behavioral problems (Depression), sleep disturbance and suicidal ideas. The patient is nervous/anxious.    Today's Vitals   04/09/20 1029  BP: 127/72  Pulse: 89  Resp: 16  Temp: (!) 97.4 F (36.3 C)  SpO2: 97%  Weight: 233 lb 9.6 oz (106 kg)  Height: 5\' 6"  (1.676 m)   Body mass index is 37.7 kg/m.  Physical Exam Vitals and nursing note reviewed.  Constitutional:      General: She is not in acute distress.    Appearance: Normal appearance. She is well-developed. She is not diaphoretic.  HENT:     Head: Normocephalic and atraumatic.     Mouth/Throat:     Pharynx: No oropharyngeal exudate.  Eyes:     Pupils: Pupils are equal, round, and reactive to light.  Neck:     Thyroid: No  thyromegaly.     Vascular: No JVD.     Trachea: No tracheal deviation.  Cardiovascular:     Rate and Rhythm: Normal rate and regular rhythm.     Heart sounds: Normal heart sounds. No murmur heard.  No friction rub. No gallop.   Pulmonary:     Effort: Pulmonary effort is normal. No respiratory distress.     Breath sounds: Normal breath sounds. No wheezing or rales.  Chest:     Chest wall: No tenderness.  Abdominal:     Palpations: Abdomen is soft.  Musculoskeletal:        General: Normal range of motion.  Cervical back: Normal range of motion and neck supple.  Lymphadenopathy:     Cervical: No cervical adenopathy.  Skin:    General: Skin is warm and dry.     Capillary Refill: Capillary refill takes less than 2 seconds.  Neurological:     General: No focal deficit present.     Mental Status: She is alert and oriented to person, place, and time.     Cranial Nerves: No cranial nerve deficit.  Psychiatric:        Behavior: Behavior normal.        Thought Content: Thought content normal.        Judgment: Judgment normal.    Assessment/Plan: 1. Type 2 diabetes mellitus with hyperglycemia, with long-term current use of insulin (HCC) - POCT HgB A1C 7.4 today, up from 7.1 at lat check. No changes made to medication. Discussed limiting intake of carbohydrates and sugar and incorporating exercise into daily routine to help lower blood sugars without increasing medication dosing.   2. Hypertension associated with diabetes (HCC) Stable. Continue bp medication as prescribed  3. Hypokalemia conitnue current dose KCL as prescribed.  - potassium chloride (KLOR-CON) 10 MEQ tablet; Take 1 tablet (10 mEq total) by mouth daily.  Dispense: 90 tablet; Refill: 3  4. Generalized anxiety disorder May take alprazolam 0.5mg  up to twice daily as needed for acute anxiety. New prescription sent to her pharmacy today.  - ALPRAZolam (XANAX) 0.5 MG tablet; Take 1 tablet (0.5 mg total) by mouth 2 (two)  times daily as needed for anxiety.  Dispense: 45 tablet; Refill: 2  5. Encounter for long-term (current) use of medications - POCT Urine Drug Screen appropriately positive for BZO today.   General Counseling: sarissa dern understanding of the findings of todays visit and agrees with plan of treatment. I have discussed any further diagnostic evaluation that may be needed or ordered today. We also reviewed her medications today. she has been encouraged to call the office with any questions or concerns that should arise related to todays visit.  Diabetes Counseling:  1. Addition of ACE inh/ ARB'S for nephroprotection. Microalbumin is updated  2. Diabetic foot care, prevention of complications. Podiatry consult 3. Exercise and lose weight.  4. Diabetic eye examination, Diabetic eye exam is updated  5. Monitor blood sugar closlely. nutrition counseling.  6. Sign and symptoms of hypoglycemia including shaking sweating,confusion and headaches.  This patient was seen by Vincent Gros FNP Collaboration with Dr Lyndon Code as a part of collaborative care agreement  Orders Placed This Encounter  Procedures   POCT HgB A1C   POCT Urine Drug Screen    Meds ordered this encounter  Medications   ALPRAZolam (XANAX) 0.5 MG tablet    Sig: Take 1 tablet (0.5 mg total) by mouth 2 (two) times daily as needed for anxiety.    Dispense:  45 tablet    Refill:  2    Order Specific Question:   Supervising Provider    Answer:   Lyndon Code [[id number]]   potassium chloride (KLOR-CON) 10 MEQ tablet    Sig: Take 1 tablet (10 mEq total) by mouth daily.    Dispense:  90 tablet    Refill:  3    Order Specific Question:   Supervising Provider    Answer:   Lyndon Code [[id number]]    Total time spent: 30 Minutes   Time spent includes review of chart, medications, test results, and follow up plan with the patient.  Dr Lavera Guise Internal medicine

## 2020-04-20 DIAGNOSIS — Z79899 Other long term (current) drug therapy: Secondary | ICD-10-CM | POA: Insufficient documentation

## 2020-07-04 ENCOUNTER — Telehealth: Payer: Self-pay

## 2020-07-04 NOTE — Telephone Encounter (Signed)
LMOM for office visit on 9/13 

## 2020-07-07 ENCOUNTER — Ambulatory Visit (INDEPENDENT_AMBULATORY_CARE_PROVIDER_SITE_OTHER): Payer: No Typology Code available for payment source | Admitting: Nurse Practitioner

## 2020-07-07 ENCOUNTER — Other Ambulatory Visit: Payer: Self-pay

## 2020-07-07 ENCOUNTER — Encounter: Payer: Self-pay | Admitting: Nurse Practitioner

## 2020-07-07 VITALS — BP 148/90 | HR 90 | Temp 97.5°F | Resp 16 | Ht 66.0 in | Wt 234.2 lb

## 2020-07-07 DIAGNOSIS — I10 Essential (primary) hypertension: Secondary | ICD-10-CM

## 2020-07-07 DIAGNOSIS — Z794 Long term (current) use of insulin: Secondary | ICD-10-CM | POA: Diagnosis not present

## 2020-07-07 DIAGNOSIS — E1165 Type 2 diabetes mellitus with hyperglycemia: Secondary | ICD-10-CM | POA: Diagnosis not present

## 2020-07-07 DIAGNOSIS — E876 Hypokalemia: Secondary | ICD-10-CM

## 2020-07-07 LAB — POCT GLYCOSYLATED HEMOGLOBIN (HGB A1C): Hemoglobin A1C: 6.6 % — AB (ref 4.0–5.6)

## 2020-07-07 MED ORDER — HYDROCHLOROTHIAZIDE 12.5 MG PO TABS: 25.0000 mg | ORAL_TABLET | Freq: Every day | ORAL | 3 refills | Status: DC

## 2020-07-07 NOTE — Progress Notes (Signed)
[facility] [address] [place], [place] [address]  Internal MEDICINE  Office Visit Note  Patient Name: Vicki Simmons  [id number]  [id number]  Date of Service: 07/23/2020  Chief Complaint  Patient presents with  . Follow-up  . Diabetes  . Hypertension    The patient is here for routine follow up. Blood sugars have improved since last visit. HgbA1c is 6.6 today, down from 7.4 at her last visit. She states that she has had some low blood sugars at night. Will wake up sweating and shaking. Has to eat something because blood sugar is 59. She is currently taking metformin and Humalog mix 60 units twice daily.  Blood pressure is mildly elevated today. She states that she has felt blood pressure elevated recently. She states that she has had some increased personal stress which may be contributing to this. She states that her face feels hot and she feels full of fluid when blood pressure elevated.       Current Medication: Outpatient Encounter Medications as of 07/07/2020  Medication Sig  . ALPRAZolam (XANAX) 0.5 MG tablet Take 1 tablet (0.5 mg total) by mouth 2 (two) times daily as needed for anxiety.  . cyclobenzaprine (FLEXERIL) 10 MG tablet Take 1 tablet (10 mg total) by mouth at bedtime as needed for muscle spasms.  Marland Kitchen glimepiride (AMARYL) 4 MG tablet Take 1 tablet (4 mg total) by mouth 2 (two) times daily.  . hydrochlorothiazide (HYDRODIURIL) 12.5 MG tablet Take 2 tablets (25 mg total) by mouth daily.  . insulin NPH Human (HUMULIN N,NOVOLIN N) 100 UNIT/ML injection Inject 60units [place] QAM and 70 units [place] QPM  . lisinopril (ZESTRIL) 40 MG tablet Take 1 tablet po QD  . metFORMIN (GLUCOPHAGE-XR) 500 MG 24 hr tablet Take 2 tablets by mouth twice daily  . naproxen (NAPROSYN) 500 MG tablet Take 1 tablet (500 mg total) by mouth 2 (two) times daily with a meal.  . potassium chloride (KLOR-CON) 10 MEQ tablet Take 1 tablet (10 mEq total) by mouth daily.  . traMADol (ULTRAM) 50 MG  tablet Take 1 tablet (50 mg total) by mouth every 12 (twelve) hours as needed.  . [DISCONTINUED] hydrochlorothiazide (HYDRODIURIL) 12.5 MG tablet Take 1 tablet (12.5 mg total) by mouth daily.   No facility-administered encounter medications on file as of 07/07/2020.    Surgical History: Past Surgical History:  Procedure Laterality Date  . CESAREAN SECTION    . TUBAL LIGATION      Medical History: Past Medical History:  Diagnosis Date  . Diabetes (HCC)   . Hypertension     Family History: Family History  Problem Relation Age of Onset  . Diabetes Mother   . Hyperlipidemia Mother   . Hypertension Mother   . Heart attack Father   . Stroke Maternal Grandmother     Social History   Socioeconomic History  . Marital status: Married    Spouse name: Not on file  . Number of children: Not on file  . Years of education: Not on file  . Highest education level: Not on file  Occupational History  . Not on file  Tobacco Use  . Smoking status: Current Every Day Smoker    Packs/day: 1.00    Types: Cigarettes  . Smokeless tobacco: Never Used  Substance and Sexual Activity  . Alcohol use: No  . Drug use: No  . Sexual activity: Not on file  Other Topics Concern  . Not on file  Social History Narrative  .  Not on file   Social Determinants of Health   Financial Resource Strain:   . Difficulty of Paying Living Expenses: Not on file  Food Insecurity:   . Worried About Programme researcher, broadcasting/film/video in the Last Year: Not on file  . Ran Out of Food in the Last Year: Not on file  Transportation Needs:   . Lack of Transportation (Medical): Not on file  . Lack of Transportation (Non-Medical): Not on file  Physical Activity:   . Days of Exercise per Week: Not on file  . Minutes of Exercise per Session: Not on file  Stress:   . Feeling of Stress : Not on file  Social Connections:   . Frequency of Communication with Friends and Family: Not on file  . Frequency of Social Gatherings with  Friends and Family: Not on file  . Attends Religious Services: Not on file  . Active Member of Clubs or Organizations: Not on file  . Attends Banker Meetings: Not on file  . Marital Status: Not on file  Intimate Partner Violence:   . Fear of Current or Ex-Partner: Not on file  . Emotionally Abused: Not on file  . Physically Abused: Not on file  . Sexually Abused: Not on file      Review of Systems  Constitutional: Negative for chills, fatigue and unexpected weight change.       Weight stable since her last visit   HENT: Negative for congestion, postnasal drip, rhinorrhea, sneezing and sore throat.   Respiratory: Negative for cough, chest tightness and shortness of breath.   Cardiovascular: Negative for chest pain and palpitations.  Gastrointestinal: Negative for abdominal pain, constipation, diarrhea, nausea and vomiting.  Endocrine: Negative for cold intolerance, heat intolerance, polydipsia and polyuria.       Blood sugars stable.   Musculoskeletal: Negative for arthralgias, back pain, joint swelling and neck pain.  Skin: Negative for rash.  Allergic/Immunologic: Negative for environmental allergies.  Neurological: Negative for dizziness, tremors, numbness and headaches.  Hematological: Negative for adenopathy. Does not bruise/bleed easily.  Psychiatric/Behavioral: Negative for behavioral problems (Depression), sleep disturbance and suicidal ideas. The patient is nervous/anxious.    Today's Vitals   07/07/20 0940  BP: (!) 148/90  Pulse: 90  Resp: 16  Temp: (!) 97.5 F (36.4 C)  SpO2: 98%  Weight: 234 lb 3.2 oz (106.2 kg)  Height: 5\' 6"  (1.676 m)   Body mass index is 37.8 kg/m.  Physical Exam Vitals and nursing note reviewed.  Constitutional:      General: She is not in acute distress.    Appearance: Normal appearance. She is well-developed. She is not diaphoretic.  HENT:     Head: Normocephalic and atraumatic.     Mouth/Throat:     Pharynx: No  oropharyngeal exudate.  Eyes:     Pupils: Pupils are equal, round, and reactive to light.  Neck:     Thyroid: No thyromegaly.     Vascular: No JVD.     Trachea: No tracheal deviation.  Cardiovascular:     Rate and Rhythm: Normal rate and regular rhythm.     Heart sounds: Normal heart sounds. No murmur heard.  No friction rub. No gallop.   Pulmonary:     Effort: Pulmonary effort is normal. No respiratory distress.     Breath sounds: Normal breath sounds. No wheezing or rales.  Chest:     Chest wall: No tenderness.  Abdominal:     Palpations: Abdomen is soft.  Musculoskeletal:        General: Normal range of motion.     Cervical back: Normal range of motion and neck supple.  Lymphadenopathy:     Cervical: No cervical adenopathy.  Skin:    General: Skin is warm and dry.     Capillary Refill: Capillary refill takes less than 2 seconds.  Neurological:     General: No focal deficit present.     Mental Status: She is alert and oriented to person, place, and time.     Cranial Nerves: No cranial nerve deficit.  Psychiatric:        Behavior: Behavior normal.        Thought Content: Thought content normal.        Judgment: Judgment normal.    Assessment/Plan: 1. Type 2 diabetes mellitus with hyperglycemia, with long-term current use of insulin (HCC) - POCT HgB A1C 6.6 today. Continue all diabetic medication as prescribed. Monitor sugars closely.   2. Essential hypertension, benign Increased HCTZ to 25mg  daily. conitnue other bp medication as prescribed  - hydrochlorothiazide (HYDRODIURIL) 12.5 MG tablet; Take 2 tablets (25 mg total) by mouth daily.  Dispense: 180 tablet; Refill: 3  3. Hypokalemia Continue potassium supplementation as prescribed   General Counseling: understanding of the findings of todays visit and agrees with plan of treatment. I have discussed any further diagnostic evaluation that may be needed or ordered today. We also reviewed her medications  today. she has been encouraged to call the office with any questions or concerns that should arise related to todays visit.  Diabetes Counseling:  1. Addition of ACE inh/ ARB'S for nephroprotection. Microalbumin is updated  2. Diabetic foot care, prevention of complications. Podiatry consult 3. Exercise and lose weight.  4. Diabetic eye examination, Diabetic eye exam is updated  5. Monitor blood sugar closlely. nutrition counseling.  6. Sign and symptoms of hypoglycemia including shaking sweating,confusion and headaches.  This patient was seen by Letitia Neri FNP Collaboration with Dr Vincent Gros as a part of collaborative care agreement  Orders Placed This Encounter  Procedures  . POCT HgB A1C    Meds ordered this encounter  Medications  . hydrochlorothiazide (HYDRODIURIL) 12.5 MG tablet    Sig: Take 2 tablets (25 mg total) by mouth daily.    Dispense:  180 tablet    Refill:  3    Please note increase in dosing    Order Specific Question:   Supervising Provider    Answer:   Lyndon Code [[id number]]    Total time spent: 25 Minutes   Time spent includes review of chart, medications, test results, and follow up plan with the patient.      Dr Lyndon Code Internal medicine

## 2020-08-11 ENCOUNTER — Other Ambulatory Visit: Payer: Self-pay | Admitting: Nurse Practitioner

## 2020-08-11 ENCOUNTER — Telehealth: Payer: Self-pay

## 2020-08-11 DIAGNOSIS — F411 Generalized anxiety disorder: Secondary | ICD-10-CM

## 2020-08-11 MED ORDER — ALPRAZOLAM 0.5 MG PO TABS: 0.5000 mg | ORAL_TABLET | Freq: Every evening | ORAL | 0 refills | Status: DC | PRN

## 2020-08-11 NOTE — Telephone Encounter (Signed)
Sent prescription for #30 tablets to [facility].

## 2020-08-19 ENCOUNTER — Other Ambulatory Visit: Payer: Self-pay

## 2020-08-19 ENCOUNTER — Encounter: Payer: Self-pay | Admitting: Nurse Practitioner

## 2020-08-19 ENCOUNTER — Ambulatory Visit: Payer: Self-pay | Admitting: Nurse Practitioner

## 2020-08-19 VITALS — BP 124/80 | HR 91 | Temp 97.5°F | Resp 16 | Ht 66.0 in | Wt 233.0 lb

## 2020-08-19 DIAGNOSIS — E876 Hypokalemia: Secondary | ICD-10-CM

## 2020-08-19 DIAGNOSIS — E1165 Type 2 diabetes mellitus with hyperglycemia: Secondary | ICD-10-CM

## 2020-08-19 DIAGNOSIS — Z794 Long term (current) use of insulin: Secondary | ICD-10-CM

## 2020-08-19 DIAGNOSIS — I10 Essential (primary) hypertension: Secondary | ICD-10-CM

## 2020-08-19 NOTE — Progress Notes (Signed)
[facility] [address] [place], [place] [address]  Internal MEDICINE  Office Visit Note  Patient Name: Vicki Simmons  [id number]  [id number]  Date of Service: 09/07/2020  Chief Complaint  Patient presents with  . Follow-up  . Diabetes  . Hypertension  . Quality Metric Gaps    flu,tetnaus,covid  . controlled substance form    reviewed with PT    The patient is here for follow up of blood pressure. Increased HCTZ to 25mg  daily. Blood pressure much improved. She has tolerated the increase in medication well without negative side effects.  She has no new concerns or complaints today.       Current Medication: Outpatient Encounter Medications as of 08/19/2020  Medication Sig  . ALPRAZolam (XANAX) 0.5 MG tablet Take 1 tablet (0.5 mg total) by mouth at bedtime as needed for anxiety.  . cyclobenzaprine (FLEXERIL) 10 MG tablet Take 1 tablet (10 mg total) by mouth at bedtime as needed for muscle spasms.  08/21/2020 glimepiride (AMARYL) 4 MG tablet Take 1 tablet (4 mg total) by mouth 2 (two) times daily.  . hydrochlorothiazide (HYDRODIURIL) 12.5 MG tablet Take 2 tablets (25 mg total) by mouth daily.  . insulin NPH Human (HUMULIN N,NOVOLIN N) 100 UNIT/ML injection Inject 60units [place] QAM and 70 units [place] QPM  . lisinopril (ZESTRIL) 40 MG tablet Take 1 tablet po QD  . metFORMIN (GLUCOPHAGE-XR) 500 MG 24 hr tablet Take 2 tablets by mouth twice daily  . naproxen (NAPROSYN) 500 MG tablet Take 1 tablet (500 mg total) by mouth 2 (two) times daily with a meal.  . potassium chloride (KLOR-CON) 10 MEQ tablet Take 1 tablet (10 mEq total) by mouth daily.  . traMADol (ULTRAM) 50 MG tablet Take 1 tablet (50 mg total) by mouth every 12 (twelve) hours as needed.   No facility-administered encounter medications on file as of 08/19/2020.    Surgical History: Past Surgical History:  Procedure Laterality Date  . CESAREAN SECTION    . TUBAL LIGATION      Medical History: Past Medical  History:  Diagnosis Date  . Diabetes (HCC)   . Hypertension     Family History: Family History  Problem Relation Age of Onset  . Diabetes Mother   . Hyperlipidemia Mother   . Hypertension Mother   . Heart attack Father   . Stroke Maternal Grandmother     Social History   Socioeconomic History  . Marital status: Married    Spouse name: Not on file  . Number of children: Not on file  . Years of education: Not on file  . Highest education level: Not on file  Occupational History  . Not on file  Tobacco Use  . Smoking status: Current Every Day Smoker    Packs/day: 1.00    Types: Cigarettes  . Smokeless tobacco: Never Used  Substance and Sexual Activity  . Alcohol use: No  . Drug use: No  . Sexual activity: Not on file  Other Topics Concern  . Not on file  Social History Narrative  . Not on file   Social Determinants of Health   Financial Resource Strain:   . Difficulty of Paying Living Expenses: Not on file  Food Insecurity:   . Worried About 08/21/2020 in the Last Year: Not on file  . Ran Out of Food in the Last Year: Not on file  Transportation Needs:   . Lack of Transportation (Medical): Not on file  .  Lack of Transportation (Non-Medical): Not on file  Physical Activity:   . Days of Exercise per Week: Not on file  . Minutes of Exercise per Session: Not on file  Stress:   . Feeling of Stress : Not on file  Social Connections:   . Frequency of Communication with Friends and Family: Not on file  . Frequency of Social Gatherings with Friends and Family: Not on file  . Attends Religious Services: Not on file  . Active Member of Clubs or Organizations: Not on file  . Attends Banker Meetings: Not on file  . Marital Status: Not on file  Intimate Partner Violence:   . Fear of Current or Ex-Partner: Not on file  . Emotionally Abused: Not on file  . Physically Abused: Not on file  . Sexually Abused: Not on file      Review of Systems   Constitutional: Negative for chills, fatigue and unexpected weight change.  HENT: Negative for congestion, postnasal drip, rhinorrhea, sneezing and sore throat.   Respiratory: Negative for cough, chest tightness and shortness of breath.   Cardiovascular: Negative for chest pain and palpitations.       Improved blood pressure   Gastrointestinal: Negative for abdominal pain, constipation, diarrhea, nausea and vomiting.  Endocrine: Negative for cold intolerance, heat intolerance, polydipsia and polyuria.       Blood sugars stable.   Musculoskeletal: Negative for arthralgias, back pain, joint swelling and neck pain.  Skin: Negative for rash.  Allergic/Immunologic: Negative for environmental allergies.  Neurological: Negative for dizziness, tremors, numbness and headaches.  Hematological: Negative for adenopathy. Does not bruise/bleed easily.  Psychiatric/Behavioral: Negative for behavioral problems (Depression), sleep disturbance and suicidal ideas. The patient is nervous/anxious.     Today's Vitals   08/19/20 0939  BP: 124/80  Pulse: 91  Resp: 16  Temp: (!) 97.5 F (36.4 C)  SpO2: 97%  Weight: 233 lb (105.7 kg)  Height: 5\' 6"  (1.676 m)   Body mass index is 37.61 kg/m.  Physical Exam Vitals and nursing note reviewed.  Constitutional:      General: She is not in acute distress.    Appearance: Normal appearance. She is well-developed. She is not diaphoretic.  HENT:     Head: Normocephalic and atraumatic.     Nose: Nose normal.     Mouth/Throat:     Pharynx: No oropharyngeal exudate.  Eyes:     Pupils: Pupils are equal, round, and reactive to light.  Neck:     Thyroid: No thyromegaly.     Vascular: No JVD.     Trachea: No tracheal deviation.  Cardiovascular:     Rate and Rhythm: Normal rate and regular rhythm.     Heart sounds: Normal heart sounds. No murmur heard.  No friction rub. No gallop.   Pulmonary:     Effort: Pulmonary effort is normal. No respiratory distress.      Breath sounds: Normal breath sounds. No wheezing or rales.  Chest:     Chest wall: No tenderness.  Abdominal:     Palpations: Abdomen is soft.  Musculoskeletal:        General: Normal range of motion.     Cervical back: Normal range of motion and neck supple.  Lymphadenopathy:     Cervical: No cervical adenopathy.  Skin:    General: Skin is warm and dry.  Neurological:     Mental Status: She is alert and oriented to person, place, and time.     Cranial  Nerves: No cranial nerve deficit.  Psychiatric:        Mood and Affect: Mood normal.        Behavior: Behavior normal.        Thought Content: Thought content normal.        Judgment: Judgment normal.    Assessment/Plan: 1. Essential hypertension, benign Improved. Continue HCTZ 25mg  tablets daily. Continue other b medication as prescribed   2. Type 2 diabetes mellitus with hyperglycemia, with long-term current use of insulin (HCC) Doing well. Continue diabetic medication as prescribed   3. Hypokalemia Continue potassium as prescribed   General Counseling: understanding of the findings of todays visit and agrees with plan of treatment. I have discussed any further diagnostic evaluation that may be needed or ordered today. We also reviewed her medications today. she has been encouraged to call the office with any questions or concerns that should arise related to todays visit.  This patient was seen by Letitia Neri FNP Collaboration with Dr Vincent Gros as a part of collaborative care agreement  Total time spent: 25 Minutes   Time spent includes review of chart, medications, test results, and follow up plan with the patient.      Dr Lyndon Code Internal medicine

## 2020-09-30 ENCOUNTER — Other Ambulatory Visit: Payer: Self-pay

## 2020-09-30 DIAGNOSIS — I10 Essential (primary) hypertension: Secondary | ICD-10-CM

## 2020-09-30 MED ORDER — LISINOPRIL 40 MG PO TABS: ORAL_TABLET | ORAL | 3 refills | Status: DC

## 2020-10-20 ENCOUNTER — Encounter: Payer: Self-pay | Admitting: Nurse Practitioner

## 2020-10-20 ENCOUNTER — Other Ambulatory Visit: Payer: Self-pay

## 2020-10-20 ENCOUNTER — Ambulatory Visit: Payer: Self-pay | Admitting: Nurse Practitioner

## 2020-10-20 VITALS — BP 128/70 | HR 93 | Temp 97.6°F | Resp 16 | Ht 66.0 in | Wt 241.8 lb

## 2020-10-20 DIAGNOSIS — Z794 Long term (current) use of insulin: Secondary | ICD-10-CM

## 2020-10-20 DIAGNOSIS — I10 Essential (primary) hypertension: Secondary | ICD-10-CM

## 2020-10-20 DIAGNOSIS — E1165 Type 2 diabetes mellitus with hyperglycemia: Secondary | ICD-10-CM

## 2020-10-20 LAB — POCT GLYCOSYLATED HEMOGLOBIN (HGB A1C): Hemoglobin A1C: 6.7 % — AB (ref 4.0–5.6)

## 2020-10-20 NOTE — Progress Notes (Signed)
[facility] [address] [place], [place] [address]  Internal MEDICINE  Office Visit Note  Patient Name: Vicki Simmons  [id number]  [id number]  Date of Service: 11/13/2020  Chief Complaint  Patient presents with  . Follow-up  . Diabetes  . Hypertension  . controlled substance form    reviewed    The patient is here for follow up visit. She states that her weight is up about 10 pounds. Blood pressure is elevated this morning. Blood pressure did return to normal levels during the time of visit.  Has been taking her medication as prescribed everyday. Has been eating later in the days. Has been remodeling her home and states that she gets back to current home, it is late and she eats full meal. Sure that is where weight gain is coming from.  Blood sugars remain well managed. HgbA1c is 6.7 today. Continues to manage well with current medication. She is due to have diabetic eye exam      Current Medication: Outpatient Encounter Medications as of 10/20/2020  Medication Sig  . ALPRAZolam (XANAX) 0.5 MG tablet Take 1 tablet (0.5 mg total) by mouth at bedtime as needed for anxiety.  . cyclobenzaprine (FLEXERIL) 10 MG tablet Take 1 tablet (10 mg total) by mouth at bedtime as needed for muscle spasms.  . hydrochlorothiazide (HYDRODIURIL) 12.5 MG tablet Take 2 tablets (25 mg total) by mouth daily.  . insulin NPH Human (HUMULIN N,NOVOLIN N) 100 UNIT/ML injection Inject 60units [place] QAM and 70 units [place] QPM  . lisinopril (ZESTRIL) 40 MG tablet Take 1 tablet po QD  . metFORMIN (GLUCOPHAGE-XR) 500 MG 24 hr tablet Take 2 tablets by mouth twice daily  . naproxen (NAPROSYN) 500 MG tablet Take 1 tablet (500 mg total) by mouth 2 (two) times daily with a meal.  . potassium chloride (KLOR-CON) 10 MEQ tablet Take 1 tablet (10 mEq total) by mouth daily.  . traMADol (ULTRAM) 50 MG tablet Take 1 tablet (50 mg total) by mouth every 12 (twelve) hours as needed.  . [DISCONTINUED] glimepiride  (AMARYL) 4 MG tablet Take 1 tablet (4 mg total) by mouth 2 (two) times daily.   No facility-administered encounter medications on file as of 10/20/2020.    Surgical History: Past Surgical History:  Procedure Laterality Date  . CESAREAN SECTION    . TUBAL LIGATION      Medical History: Past Medical History:  Diagnosis Date  . Diabetes (HCC)   . Hypertension     Family History: Family History  Problem Relation Age of Onset  . Diabetes Mother   . Hyperlipidemia Mother   . Hypertension Mother   . Heart attack Father   . Stroke Maternal Grandmother     Social History   Socioeconomic History  . Marital status: Married    Spouse name: Not on file  . Number of children: Not on file  . Years of education: Not on file  . Highest education level: Not on file  Occupational History  . Not on file  Tobacco Use  . Smoking status: Current Every Day Smoker    Packs/day: 1.00    Types: Cigarettes  . Smokeless tobacco: Never Used  Substance and Sexual Activity  . Alcohol use: No  . Drug use: No  . Sexual activity: Not on file  Other Topics Concern  . Not on file  Social History Narrative  . Not on file   Social Determinants of Health   Financial Resource Strain: Not  on file  Food Insecurity: Not on file  Transportation Needs: Not on file  Physical Activity: Not on file  Stress: Not on file  Social Connections: Not on file  Intimate Partner Violence: Not on file      Review of Systems  Constitutional: Negative for chills, fatigue and unexpected weight change.  HENT: Negative for congestion, postnasal drip, rhinorrhea, sneezing and sore throat.   Respiratory: Negative for cough, chest tightness and shortness of breath.   Cardiovascular: Negative for chest pain and palpitations.       Stable blood pressure  Gastrointestinal: Negative for abdominal pain, constipation, diarrhea, nausea and vomiting.  Endocrine: Negative for cold intolerance, heat intolerance,  polydipsia and polyuria.       Blood sugars stable.   Musculoskeletal: Negative for arthralgias, back pain, joint swelling and neck pain.  Skin: Negative for rash.  Allergic/Immunologic: Negative for environmental allergies.  Neurological: Negative for dizziness, tremors, numbness and headaches.  Hematological: Negative for adenopathy. Does not bruise/bleed easily.  Psychiatric/Behavioral: Negative for behavioral problems (Depression), sleep disturbance and suicidal ideas. The patient is nervous/anxious.     Today's Vitals   10/20/20 0917  BP: 128/70  Pulse: 93  Resp: 16  Temp: 97.6 F (36.4 C)  SpO2: 97%  Weight: 241 lb 12.8 oz (109.7 kg)  Height: 5\' 6"  (1.676 m)   Body mass index is 39.03 kg/m.  Physical Exam Vitals and nursing note reviewed.  Constitutional:      General: She is not in acute distress.    Appearance: Normal appearance. She is well-developed. She is obese. She is not diaphoretic.  HENT:     Head: Normocephalic and atraumatic.     Nose: Nose normal.     Mouth/Throat:     Pharynx: No oropharyngeal exudate.  Eyes:     Pupils: Pupils are equal, round, and reactive to light.  Neck:     Thyroid: No thyromegaly.     Vascular: No JVD.     Trachea: No tracheal deviation.  Cardiovascular:     Rate and Rhythm: Normal rate and regular rhythm.     Heart sounds: Normal heart sounds. No murmur heard. No friction rub. No gallop.   Pulmonary:     Effort: Pulmonary effort is normal. No respiratory distress.     Breath sounds: Normal breath sounds. No wheezing or rales.  Chest:     Chest wall: No tenderness.  Abdominal:     Palpations: Abdomen is soft.  Musculoskeletal:        General: Normal range of motion.     Cervical back: Normal range of motion and neck supple.  Lymphadenopathy:     Cervical: No cervical adenopathy.  Skin:    General: Skin is warm and dry.  Neurological:     Mental Status: She is alert and oriented to person, place, and time.      Cranial Nerves: No cranial nerve deficit.  Psychiatric:        Mood and Affect: Mood normal.        Behavior: Behavior normal.        Thought Content: Thought content normal.        Judgment: Judgment normal.    Assessment/Plan:  1. Type 2 diabetes mellitus with hyperglycemia, with long-term current use of insulin (HCC) - POCT HgB A1C 6.7 tday. Continue all diabetic medication as prescribed. Refer for diabetic eye exam.  - Ambulatory referral to Ophthalmology  2. Essential hypertension, benign Stable. Continue bp medication as prescribed  General Counseling: liah morr understanding of the findings of todays visit and agrees with plan of treatment. I have discussed any further diagnostic evaluation that may be needed or ordered today. We also reviewed her medications today. she has been encouraged to call the office with any questions or concerns that should arise related to todays visit.  Diabetes Counseling:  1. Addition of ACE inh/ ARB'S for nephroprotection. Microalbumin is updated  2. Diabetic foot care, prevention of complications. Podiatry consult 3. Exercise and lose weight.  4. Diabetic eye examination, Diabetic eye exam is updated  5. Monitor blood sugar closlely. nutrition counseling.  6. Sign and symptoms of hypoglycemia including shaking sweating,confusion and headaches.  This patient was seen by Vincent Gros FNP Collaboration with Dr Lyndon Code as a part of collaborative care agreement  Orders Placed This Encounter  Procedures  . Ambulatory referral to Ophthalmology  . POCT HgB A1C     Total time spent: 30 Minutes   Time spent includes review of chart, medications, test results, and follow up plan with the patient.      Dr Lyndon Code Internal medicine

## 2020-11-03 ENCOUNTER — Other Ambulatory Visit: Payer: Self-pay

## 2020-11-03 DIAGNOSIS — E1165 Type 2 diabetes mellitus with hyperglycemia: Secondary | ICD-10-CM

## 2020-11-03 MED ORDER — GLIMEPIRIDE 4 MG PO TABS: 4.0000 mg | ORAL_TABLET | Freq: Two times a day (BID) | ORAL | 3 refills | Status: DC

## 2021-01-13 ENCOUNTER — Ambulatory Visit: Payer: Medicaid Other | Admitting: Nurse Practitioner

## 2021-01-19 ENCOUNTER — Other Ambulatory Visit: Payer: Self-pay

## 2021-01-19 ENCOUNTER — Ambulatory Visit: Payer: No Typology Code available for payment source | Admitting: Physician Assistant

## 2021-01-19 ENCOUNTER — Encounter: Payer: Self-pay | Admitting: Hospice and Palliative Medicine

## 2021-01-19 DIAGNOSIS — Z794 Long term (current) use of insulin: Secondary | ICD-10-CM

## 2021-01-19 DIAGNOSIS — E119 Type 2 diabetes mellitus without complications: Secondary | ICD-10-CM | POA: Diagnosis not present

## 2021-01-19 DIAGNOSIS — I10 Essential (primary) hypertension: Secondary | ICD-10-CM

## 2021-01-19 DIAGNOSIS — E1165 Type 2 diabetes mellitus with hyperglycemia: Secondary | ICD-10-CM | POA: Diagnosis not present

## 2021-01-19 DIAGNOSIS — F411 Generalized anxiety disorder: Secondary | ICD-10-CM | POA: Diagnosis not present

## 2021-01-19 DIAGNOSIS — E669 Obesity, unspecified: Secondary | ICD-10-CM

## 2021-01-19 LAB — POCT GLYCOSYLATED HEMOGLOBIN (HGB A1C): Hemoglobin A1C: 7 % — AB (ref 4.0–5.6)

## 2021-01-19 MED ORDER — GLIMEPIRIDE 4 MG PO TABS: 4.0000 mg | ORAL_TABLET | Freq: Two times a day (BID) | ORAL | 1 refills | Status: DC

## 2021-01-19 MED ORDER — METFORMIN HCL ER 500 MG PO TB24: 1000.0000 mg | ORAL_TABLET | Freq: Two times a day (BID) | ORAL | 0 refills | Status: DC

## 2021-01-19 NOTE — Progress Notes (Signed)
[facility] [address] [place], [place] [address]  Internal MEDICINE  Office Visit Note  Patient Name: Vicki Simmons  [id number]  [id number]  Date of Service: 01/19/2021  Chief Complaint  Patient presents with  . Diabetes  . Hypertension    HPI Pt is here for routine f/u. She has no complaints. -BG: low 100s fasting numbers. She does have some lows at night, but always know when it is low and adjusts. Dicussed that if she starts having more frequent lows at night then will need to decrease her nightly insulin.  -BP at home has been stable. -Anxiety has been stable. She has not taken xanax in several months and does not feel like she needs any medications anymore. -She has lost 6 lbs since last visit.  Current Medication: Outpatient Encounter Medications as of 01/19/2021  Medication Sig  . ALPRAZolam (XANAX) 0.5 MG tablet Take 1 tablet (0.5 mg total) by mouth at bedtime as needed for anxiety.  . cyclobenzaprine (FLEXERIL) 10 MG tablet Take 1 tablet (10 mg total) by mouth at bedtime as needed for muscle spasms.  . hydrochlorothiazide (HYDRODIURIL) 12.5 MG tablet Take 2 tablets (25 mg total) by mouth daily.  . insulin NPH Human (HUMULIN N,NOVOLIN N) 100 UNIT/ML injection Inject 60units [place] QAM and 70 units [place] QPM  . lisinopril (ZESTRIL) 40 MG tablet Take 1 tablet po QD  . naproxen (NAPROSYN) 500 MG tablet Take 1 tablet (500 mg total) by mouth 2 (two) times daily with a meal.  . potassium chloride (KLOR-CON) 10 MEQ tablet Take 1 tablet (10 mEq total) by mouth daily.  . traMADol (ULTRAM) 50 MG tablet Take 1 tablet (50 mg total) by mouth every 12 (twelve) hours as needed.  . [DISCONTINUED] glimepiride (AMARYL) 4 MG tablet Take 1 tablet (4 mg total) by mouth 2 (two) times daily.  . [DISCONTINUED] metFORMIN (GLUCOPHAGE-XR) 500 MG 24 hr tablet Take 2 tablets by mouth twice daily  . glimepiride (AMARYL) 4 MG tablet Take 1 tablet (4 mg total) by mouth 2 (two) times daily.  .  metFORMIN (GLUCOPHAGE-XR) 500 MG 24 hr tablet Take 2 tablets (1,000 mg total) by mouth 2 (two) times daily.   No facility-administered encounter medications on file as of 01/19/2021.    Surgical History: Past Surgical History:  Procedure Laterality Date  . CESAREAN SECTION    . TUBAL LIGATION      Medical History: Past Medical History:  Diagnosis Date  . Diabetes (HCC)   . Hypertension     Family History: Family History  Problem Relation Age of Onset  . Diabetes Mother   . Hyperlipidemia Mother   . Hypertension Mother   . Heart attack Father   . Stroke Maternal Grandmother     Social History   Socioeconomic History  . Marital status: Married    Spouse name: Not on file  . Number of children: Not on file  . Years of education: Not on file  . Highest education level: Not on file  Occupational History  . Not on file  Tobacco Use  . Smoking status: Current Every Day Smoker    Packs/day: 1.00    Types: Cigarettes  . Smokeless tobacco: Never Used  Substance and Sexual Activity  . Alcohol use: No  . Drug use: No  . Sexual activity: Not on file  Other Topics Concern  . Not on file  Social History Narrative  . Not on file   Social Determinants of Health  Financial Resource Strain: Not on file  Food Insecurity: Not on file  Transportation Needs: Not on file  Physical Activity: Not on file  Stress: Not on file  Social Connections: Not on file  Intimate Partner Violence: Not on file      Review of Systems  Constitutional: Negative for chills, fatigue and unexpected weight change.  HENT: Negative for congestion, postnasal drip, rhinorrhea, sneezing and sore throat.   Eyes: Negative for redness and visual disturbance.  Respiratory: Negative for cough, chest tightness and shortness of breath.   Cardiovascular: Negative for chest pain and palpitations.  Gastrointestinal: Negative for abdominal pain, constipation, diarrhea, nausea and vomiting.  Genitourinary:  Negative for dysuria and frequency.  Musculoskeletal: Negative for arthralgias, back pain, joint swelling and neck pain.  Skin: Negative for rash.  Neurological: Negative.  Negative for tremors and numbness.  Hematological: Negative for adenopathy. Does not bruise/bleed easily.  Psychiatric/Behavioral: Negative for behavioral problems (Depression), sleep disturbance and suicidal ideas. The patient is not nervous/anxious.     Vital Signs: BP 130/82   Pulse 71   Temp 98.3 F (36.8 C)   Resp 16   Ht 5\' 7"  (1.702 m)   Wt 235 lb 12.8 oz (107 kg)   SpO2 98%   BMI 36.93 kg/m    Physical Exam Vitals and nursing note reviewed.  Constitutional:      General: She is not in acute distress.    Appearance: She is well-developed. She is obese. She is not diaphoretic.  HENT:     Head: Normocephalic and atraumatic.     Mouth/Throat:     Pharynx: No oropharyngeal exudate.  Eyes:     Pupils: Pupils are equal, round, and reactive to light.  Neck:     Thyroid: No thyromegaly.     Vascular: No JVD.     Trachea: No tracheal deviation.  Cardiovascular:     Rate and Rhythm: Normal rate and regular rhythm.     Heart sounds: Normal heart sounds. No murmur heard. No friction rub. No gallop.   Pulmonary:     Effort: Pulmonary effort is normal. No respiratory distress.     Breath sounds: No wheezing or rales.  Chest:     Chest wall: No tenderness.  Abdominal:     General: Bowel sounds are normal.     Palpations: Abdomen is soft.  Musculoskeletal:        General: Normal range of motion.     Cervical back: Normal range of motion and neck supple.  Lymphadenopathy:     Cervical: No cervical adenopathy.  Skin:    General: Skin is warm and dry.  Neurological:     Mental Status: She is alert and oriented to person, place, and time.     Cranial Nerves: No cranial nerve deficit.  Psychiatric:        Behavior: Behavior normal.        Thought Content: Thought content normal.        Judgment:  Judgment normal.        Assessment/Plan: 1. Type 2 diabetes mellitus with hyperglycemia, with long-term current use of insulin (HCC) - POCT HgB A1C is 7.0 today. Continue current medications and improving diet/exercise. She will decrease nightly insulin down from 70units if she has more nightly lows, she may also take nightly dose earlier in evening. - glimepiride (AMARYL) 4 MG tablet; Take 1 tablet (4 mg total) by mouth 2 (two) times daily.  Dispense: 180 tablet; Refill: 1 - metFORMIN (  GLUCOPHAGE-XR) 500 MG 24 hr tablet; Take 2 tablets (1,000 mg total) by mouth 2 (two) times daily.  Dispense: 360 tablet; Refill: 0  2. Essential hypertension, benign Well controlled, continue lisinopril and HCTZ. Continue to monitor.  3. Generalized anxiety disorder Stable. Pt has not used xanax in months and feels good without medications. Continue to monitor.  4. Obesity (BMI 35.0-39.9 without comorbidity) Pt has lost 6lbs since last visit and is making an effort to improve diet/exercise. Obesity Counseling: Had a lengthy discussion regarding patients BMI and weight issues. Patient was instructed on portion control as well as increased activity. Also discussed caloric restrictions with trying to maintain intake less than 2000 Kcal. Discussions were made in accordance with the 5As of weight management. Simple actions such as not eating late and if able to, taking a walk is suggested.    General Counseling: bellina tokarczyk understanding of the findings of todays visit and agrees with plan of treatment. I have discussed any further diagnostic evaluation that may be needed or ordered today. We also reviewed her medications today. she has been encouraged to call the office with any questions or concerns that should arise related to todays visit.    Orders Placed This Encounter  Procedures  . POCT HgB A1C    Meds ordered this encounter  Medications  . glimepiride (AMARYL) 4 MG tablet    Sig: Take 1  tablet (4 mg total) by mouth 2 (two) times daily.    Dispense:  180 tablet    Refill:  1  . metFORMIN (GLUCOPHAGE-XR) 500 MG 24 hr tablet    Sig: Take 2 tablets (1,000 mg total) by mouth 2 (two) times daily.    Dispense:  360 tablet    Refill:  0    This patient was seen by Lynn Ito, PA-C in collaboration with Dr. Beverely Risen as a part of collaborative care agreement.   Total time spent:30 Minutes Time spent includes review of chart, medications, test results, and follow up plan with the patient.      Dr Lyndon Code Internal medicine

## 2021-02-05 ENCOUNTER — Ambulatory Visit: Payer: Medicaid Other | Admitting: Nurse Practitioner

## 2021-04-06 ENCOUNTER — Other Ambulatory Visit: Payer: Self-pay | Admitting: Nurse Practitioner

## 2021-04-06 DIAGNOSIS — Z794 Long term (current) use of insulin: Secondary | ICD-10-CM

## 2021-04-19 ENCOUNTER — Other Ambulatory Visit: Payer: Self-pay | Admitting: Physician Assistant

## 2021-04-19 DIAGNOSIS — Z794 Long term (current) use of insulin: Secondary | ICD-10-CM

## 2021-04-23 ENCOUNTER — Ambulatory Visit: Payer: Medicaid Other | Admitting: Physician Assistant

## 2021-05-22 ENCOUNTER — Ambulatory Visit: Payer: Medicaid Other | Admitting: Physician Assistant

## 2021-06-08 ENCOUNTER — Other Ambulatory Visit: Payer: Self-pay

## 2021-06-08 ENCOUNTER — Ambulatory Visit (INDEPENDENT_AMBULATORY_CARE_PROVIDER_SITE_OTHER): Payer: No Typology Code available for payment source | Admitting: Physician Assistant

## 2021-06-08 ENCOUNTER — Telehealth: Payer: Self-pay

## 2021-06-08 ENCOUNTER — Encounter: Payer: Self-pay | Admitting: Physician Assistant

## 2021-06-08 DIAGNOSIS — E782 Mixed hyperlipidemia: Secondary | ICD-10-CM | POA: Diagnosis not present

## 2021-06-08 DIAGNOSIS — R5383 Other fatigue: Secondary | ICD-10-CM | POA: Diagnosis not present

## 2021-06-08 DIAGNOSIS — Z794 Long term (current) use of insulin: Secondary | ICD-10-CM

## 2021-06-08 DIAGNOSIS — E1165 Type 2 diabetes mellitus with hyperglycemia: Secondary | ICD-10-CM | POA: Diagnosis not present

## 2021-06-08 DIAGNOSIS — I10 Essential (primary) hypertension: Secondary | ICD-10-CM

## 2021-06-08 DIAGNOSIS — E669 Obesity, unspecified: Secondary | ICD-10-CM

## 2021-06-08 LAB — POCT GLYCOSYLATED HEMOGLOBIN (HGB A1C): Hemoglobin A1C: 7.6 % — AB (ref 4.0–5.6)

## 2021-06-08 MED ORDER — DAPAGLIFLOZIN PROPANEDIOL 5 MG PO TABS
5.0000 mg | ORAL_TABLET | Freq: Every day | ORAL | 2 refills | Status: DC
Start: 2021-06-08 — End: 2021-12-10

## 2021-06-08 NOTE — Progress Notes (Signed)
[facility] [address] [place], [place] [address]  Internal MEDICINE  Office Visit Note  Patient Name: Vicki Simmons  [id number]  [id number]  Date of Service: 06/14/2021  Chief Complaint  Patient presents with   Follow-up   Diabetes   Hypertension   Quality Metric Gaps    CPE foot exam, eye exam    HPI Pt is here for routine follow up -BG fasting 79-130, some mornings 170s -Currently taking 1000 BID metformin, Glimepiride 4mg  BID, and 60units in Am and 70units in PM -She is working part time and sometimes goes long stretches without eating -BP not checked at home usually -Will call to schedule eye exam -Due for CPE and will order routine fasting labs to be done prior to next visit  Current Medication: Outpatient Encounter Medications as of 06/08/2021  Medication Sig   ALPRAZolam (XANAX) 0.5 MG tablet Take 1 tablet (0.5 mg total) by mouth at bedtime as needed for anxiety.   dapagliflozin propanediol (FARXIGA) 5 MG TABS tablet Take 1 tablet (5 mg total) by mouth daily before breakfast.   glimepiride (AMARYL) 4 MG tablet Take 1 tablet (4 mg total) by mouth 2 (two) times daily.   hydrochlorothiazide (HYDRODIURIL) 12.5 MG tablet Take 2 tablets (25 mg total) by mouth daily.   insulin NPH Human (HUMULIN N,NOVOLIN N) 100 UNIT/ML injection Inject 60units [place] QAM and 70 units [place] QPM   lisinopril (ZESTRIL) 40 MG tablet Take 1 tablet po QD   metFORMIN (GLUCOPHAGE-XR) 500 MG 24 hr tablet Take 2 tablets by mouth twice daily   potassium chloride (KLOR-CON) 10 MEQ tablet Take 1 tablet (10 mEq total) by mouth daily.   [DISCONTINUED] cyclobenzaprine (FLEXERIL) 10 MG tablet Take 1 tablet (10 mg total) by mouth at bedtime as needed for muscle spasms. (Patient not taking: Reported on 06/08/2021)   [DISCONTINUED] naproxen (NAPROSYN) 500 MG tablet Take 1 tablet (500 mg total) by mouth 2 (two) times daily with a meal. (Patient not taking: Reported on 06/08/2021)   [DISCONTINUED] traMADol  (ULTRAM) 50 MG tablet Take 1 tablet (50 mg total) by mouth every 12 (twelve) hours as needed. (Patient not taking: Reported on 06/08/2021)   No facility-administered encounter medications on file as of 06/08/2021.    Surgical History: Past Surgical History:  Procedure Laterality Date   CESAREAN SECTION     TUBAL LIGATION      Medical History: Past Medical History:  Diagnosis Date   Diabetes (HCC)    Hypertension     Family History: Family History  Problem Relation Age of Onset   Diabetes Mother    Hyperlipidemia Mother    Hypertension Mother    Heart attack Father    Stroke Maternal Grandmother     Social History   Socioeconomic History   Marital status: Married    Spouse name: Not on file   Number of children: Not on file   Years of education: Not on file   Highest education level: Not on file  Occupational History   Not on file  Tobacco Use   Smoking status: Every Day    Packs/day: 1.00    Types: Cigarettes   Smokeless tobacco: Never  Substance and Sexual Activity   Alcohol use: No   Drug use: No   Sexual activity: Not on file  Other Topics Concern   Not on file  Social History Narrative   Not on file   Social Determinants of Health   Financial Resource Strain: Not on  file  Food Insecurity: Not on file  Transportation Needs: Not on file  Physical Activity: Not on file  Stress: Not on file  Social Connections: Not on file  Intimate Partner Violence: Not on file      Review of Systems  Constitutional:  Negative for chills, fatigue and unexpected weight change.  HENT:  Negative for congestion, postnasal drip, rhinorrhea, sneezing and sore throat.   Eyes:  Negative for redness.  Respiratory:  Negative for cough, chest tightness and shortness of breath.   Cardiovascular:  Negative for chest pain and palpitations.  Gastrointestinal:  Negative for abdominal pain, constipation, diarrhea, nausea and vomiting.  Genitourinary:  Negative for dysuria and  frequency.  Musculoskeletal:  Negative for arthralgias, back pain, joint swelling and neck pain.  Skin:  Negative for rash.  Neurological: Negative.  Negative for tremors and numbness.  Hematological:  Negative for adenopathy. Does not bruise/bleed easily.  Psychiatric/Behavioral:  Negative for behavioral problems (Depression), sleep disturbance and suicidal ideas. The patient is not nervous/anxious.    Vital Signs: BP 136/78   Pulse 90   Temp (!) 97.2 F (36.2 C)   Resp 16   Ht 5\' 7"  (1.702 m)   Wt 215 lb 12.8 oz (97.9 kg)   SpO2 97%   BMI 33.80 kg/m    Physical Exam Vitals and nursing note reviewed.  Constitutional:      General: She is not in acute distress.    Appearance: She is well-developed. She is obese. She is not diaphoretic.  HENT:     Head: Normocephalic and atraumatic.     Mouth/Throat:     Pharynx: No oropharyngeal exudate.  Eyes:     Pupils: Pupils are equal, round, and reactive to light.  Neck:     Thyroid: No thyromegaly.     Vascular: No JVD.     Trachea: No tracheal deviation.  Cardiovascular:     Rate and Rhythm: Normal rate and regular rhythm.     Heart sounds: Normal heart sounds. No murmur heard.   No friction rub. No gallop.  Pulmonary:     Effort: Pulmonary effort is normal. No respiratory distress.     Breath sounds: No wheezing or rales.  Chest:     Chest wall: No tenderness.  Abdominal:     General: Bowel sounds are normal.     Palpations: Abdomen is soft.  Musculoskeletal:        General: Normal range of motion.     Cervical back: Normal range of motion and neck supple.  Lymphadenopathy:     Cervical: No cervical adenopathy.  Skin:    General: Skin is warm and dry.  Neurological:     Mental Status: She is alert and oriented to person, place, and time.     Cranial Nerves: No cranial nerve deficit.  Psychiatric:        Behavior: Behavior normal.        Thought Content: Thought content normal.        Judgment: Judgment normal.        Assessment/Plan: 1. Type 2 diabetes mellitus with hyperglycemia, with long-term current use of insulin (HCC) - POCT HgB A1C is 7.6 today which is elevated from 7.0 at last visit.  We will continue current medications and add 5 mg tablet of Farxiga in the morning.  Patient will need to watch blood sugars closely to ensure readings do not get too low.  We will also work on improving diet and exercise -  dapagliflozin propanediol (FARXIGA) 5 MG TABS tablet; Take 1 tablet (5 mg total) by mouth daily before breakfast.  Dispense: 30 tablet; Refill: 2  2. Essential hypertension, benign Stable, continue current medications  3. Mixed hyperlipidemia Will update labs - Lipid Panel With LDL/HDL Ratio  4. Other fatigue - CBC w/Diff/Platelet - Comprehensive metabolic panel - TSH + free T4  5. Obesity (BMI 30.0-34.9) Patient will continue to work on weight loss goals with improving diet and exercise and will add Farxiga to help with controlling blood sugars as well as aiding in weight loss   General Counseling: Letitia Neri understanding of the findings of todays visit and agrees with plan of treatment. I have discussed any further diagnostic evaluation that may be needed or ordered today. We also reviewed her medications today. she has been encouraged to call the office with any questions or concerns that should arise related to todays visit.    Orders Placed This Encounter  Procedures   CBC w/Diff/Platelet   Comprehensive metabolic panel   TSH + free T4   Lipid Panel With LDL/HDL Ratio   POCT HgB A1C    Meds ordered this encounter  Medications   dapagliflozin propanediol (FARXIGA) 5 MG TABS tablet    Sig: Take 1 tablet (5 mg total) by mouth daily before breakfast.    Dispense:  30 tablet    Refill:  2    This patient was seen by Lynn Ito, PA-C in collaboration with Dr. Beverely Risen as a part of collaborative care agreement.   Total time spent:35 Minutes Time  spent includes review of chart, medications, test results, and follow up plan with the patient.      Dr Lyndon Code Internal medicine

## 2021-06-08 NOTE — Telephone Encounter (Signed)
Pt called that farxiga is expensive advised her to go online and get coupon and try if still expensive then check with insurance which one is preferred drug and we will change it

## 2021-06-15 ENCOUNTER — Telehealth: Payer: Self-pay

## 2021-06-15 NOTE — Telephone Encounter (Signed)
Try to call pt voicemail is full she can call her insurance and see which is covered

## 2021-06-25 ENCOUNTER — Telehealth: Payer: Self-pay

## 2021-06-25 NOTE — Telephone Encounter (Signed)
Spoke with pt she will pickup samples for farxiga and she will call her insurance and see which covered and she will call us back

## 2021-06-25 NOTE — Telephone Encounter (Signed)
Try  to call several times her voicemail is full regarding her farxiga

## 2021-06-25 NOTE — Telephone Encounter (Signed)
Done

## 2021-07-09 ENCOUNTER — Other Ambulatory Visit: Payer: Self-pay | Admitting: Internal Medicine

## 2021-07-09 DIAGNOSIS — E1165 Type 2 diabetes mellitus with hyperglycemia: Secondary | ICD-10-CM

## 2021-07-09 DIAGNOSIS — Z794 Long term (current) use of insulin: Secondary | ICD-10-CM

## 2021-07-18 ENCOUNTER — Other Ambulatory Visit: Payer: Self-pay | Admitting: Physician Assistant

## 2021-07-18 DIAGNOSIS — E1165 Type 2 diabetes mellitus with hyperglycemia: Secondary | ICD-10-CM

## 2021-07-22 ENCOUNTER — Other Ambulatory Visit: Payer: Self-pay

## 2021-07-22 DIAGNOSIS — I10 Essential (primary) hypertension: Secondary | ICD-10-CM

## 2021-07-22 MED ORDER — HYDROCHLOROTHIAZIDE 12.5 MG PO TABS: 25.0000 mg | ORAL_TABLET | Freq: Every day | ORAL | 3 refills | Status: DC

## 2021-09-10 ENCOUNTER — Encounter: Payer: Self-pay | Admitting: Physician Assistant

## 2021-09-10 ENCOUNTER — Other Ambulatory Visit: Payer: Self-pay

## 2021-09-10 ENCOUNTER — Ambulatory Visit (INDEPENDENT_AMBULATORY_CARE_PROVIDER_SITE_OTHER): Payer: No Typology Code available for payment source | Admitting: Physician Assistant

## 2021-09-10 VITALS — BP 146/89 | HR 92 | Temp 98.4°F | Resp 16 | Ht 67.0 in | Wt 222.4 lb

## 2021-09-10 DIAGNOSIS — Z01419 Encounter for gynecological examination (general) (routine) without abnormal findings: Secondary | ICD-10-CM

## 2021-09-10 DIAGNOSIS — Z113 Encounter for screening for infections with a predominantly sexual mode of transmission: Secondary | ICD-10-CM

## 2021-09-10 DIAGNOSIS — Z794 Long term (current) use of insulin: Secondary | ICD-10-CM | POA: Diagnosis not present

## 2021-09-10 DIAGNOSIS — R300 Dysuria: Secondary | ICD-10-CM | POA: Diagnosis not present

## 2021-09-10 DIAGNOSIS — Z0001 Encounter for general adult medical examination with abnormal findings: Secondary | ICD-10-CM

## 2021-09-10 DIAGNOSIS — Z1231 Encounter for screening mammogram for malignant neoplasm of breast: Secondary | ICD-10-CM

## 2021-09-10 DIAGNOSIS — I10 Essential (primary) hypertension: Secondary | ICD-10-CM | POA: Diagnosis not present

## 2021-09-10 DIAGNOSIS — E1165 Type 2 diabetes mellitus with hyperglycemia: Secondary | ICD-10-CM

## 2021-09-10 DIAGNOSIS — Z124 Encounter for screening for malignant neoplasm of cervix: Secondary | ICD-10-CM | POA: Diagnosis not present

## 2021-09-10 DIAGNOSIS — E876 Hypokalemia: Secondary | ICD-10-CM

## 2021-09-10 LAB — POCT GLYCOSYLATED HEMOGLOBIN (HGB A1C): Hemoglobin A1C: 6.9 % — AB (ref 4.0–5.6)

## 2021-09-10 MED ORDER — GLIMEPIRIDE 4 MG PO TABS: 4.0000 mg | ORAL_TABLET | Freq: Two times a day (BID) | ORAL | 0 refills | Status: DC

## 2021-09-10 MED ORDER — METFORMIN HCL ER 500 MG PO TB24: 1000.0000 mg | ORAL_TABLET | Freq: Two times a day (BID) | ORAL | 0 refills | Status: DC

## 2021-09-10 MED ORDER — POTASSIUM CHLORIDE ER 10 MEQ PO TBCR: 10.0000 meq | EXTENDED_RELEASE_TABLET | Freq: Every day | ORAL | 3 refills | Status: AC

## 2021-09-10 MED ORDER — HYDROCHLOROTHIAZIDE 25 MG PO TABS: 25.0000 mg | ORAL_TABLET | Freq: Every day | ORAL | 3 refills | Status: DC

## 2021-09-10 NOTE — Progress Notes (Signed)
[facility] [place], [place] [address]  Internal MEDICINE  Office Visit Note  Patient Name: Vicki Simmons  [id number]  [id number]  Date of Service: 09/15/2021  Chief Complaint  Patient presents with   Annual Exam   Diabetes   Hypertension   Quality Metric Gaps    Foot exam     HPI Pt is here for routine health maintenance examination and has no acute concerns today -BG have been better 109-120  -Farxiga samples ran out 1 month ago, but states sugars have still been better even since stopping farxiga. She still takes  glimepiride 84m BID, metformin 100 BID and insulin. Had discussed she call her insurance about cheaper alternative to farxiga, but pt did not do this and sugars have been good--she will call now if sugars start to rise again. -BP at home not checked -She is due for her mammogram and pap will be done today, discussed she will be due to start colon cancer screening next year -Needs to schedule eye exam -Will have labs done now  Current Medication: Outpatient Encounter Medications as of 09/10/2021  Medication Sig   ALPRAZolam (XANAX) 0.5 MG tablet Take 1 tablet (0.5 mg total) by mouth at bedtime as needed for anxiety.   dapagliflozin propanediol (FARXIGA) 5 MG TABS tablet Take 1 tablet (5 mg total) by mouth daily before breakfast.   hydrochlorothiazide (HYDRODIURIL) 25 MG tablet Take 1 tablet (25 mg total) by mouth daily.   insulin NPH Human (HUMULIN N,NOVOLIN N) 100 UNIT/ML injection Inject 60units [place] QAM and 70 units [place] QPM   lisinopril (ZESTRIL) 40 MG tablet Take 1 tablet po QD   [DISCONTINUED] glimepiride (AMARYL) 4 MG tablet Take 1 tablet by mouth twice daily   [DISCONTINUED] hydrochlorothiazide (HYDRODIURIL) 12.5 MG tablet Take 2 tablets (25 mg total) by mouth daily.   [DISCONTINUED] metFORMIN (GLUCOPHAGE-XR) 500 MG 24 hr tablet Take 2 tablets by mouth twice daily   [DISCONTINUED] potassium chloride (KLOR-CON) 10 MEQ tablet Take 1  tablet (10 mEq total) by mouth daily.   glimepiride (AMARYL) 4 MG tablet Take 1 tablet (4 mg total) by mouth 2 (two) times daily.   metFORMIN (GLUCOPHAGE-XR) 500 MG 24 hr tablet Take 2 tablets (1,000 mg total) by mouth 2 (two) times daily.   potassium chloride (KLOR-CON) 10 MEQ tablet Take 1 tablet (10 mEq total) by mouth daily.   No facility-administered encounter medications on file as of 09/10/2021.    Surgical History: Past Surgical History:  Procedure Laterality Date   CESAREAN SECTION     TUBAL LIGATION      Medical History: Past Medical History:  Diagnosis Date   Diabetes (HCade    Hypertension     Family History: Family History  Problem Relation Age of Onset   Diabetes Mother    Hyperlipidemia Mother    Hypertension Mother    Heart attack Father    Stroke Maternal Grandmother       Review of Systems  Constitutional:  Negative for chills, fatigue and unexpected weight change.  HENT:  Negative for congestion, postnasal drip, rhinorrhea, sneezing and sore throat.   Eyes:  Negative for redness.  Respiratory:  Negative for cough, chest tightness and shortness of breath.   Cardiovascular:  Negative for chest pain and palpitations.  Gastrointestinal:  Negative for abdominal pain, constipation, diarrhea, nausea and vomiting.  Genitourinary:  Negative for dysuria and frequency.  Musculoskeletal:  Negative for arthralgias, back pain, joint swelling and neck pain.  Skin:  Negative for rash.  Neurological: Negative.  Negative for tremors and numbness.  Hematological:  Negative for adenopathy. Does not bruise/bleed easily.  Psychiatric/Behavioral:  Negative for behavioral problems (Depression), sleep disturbance and suicidal ideas. The patient is not nervous/anxious.     Vital Signs: BP (!) 146/89   Pulse 92   Temp 98.4 F (36.9 C)   Resp 16   Ht _0  (1.702 m)   Wt 222 lb 6.4 oz (100.9 kg)   SpO2 98%   BMI 34.83 kg/m    Physical Exam Vitals and nursing note  reviewed.  Constitutional:      General: She is not in acute distress.    Appearance: She is well-developed. She is obese. She is not diaphoretic.  HENT:     Head: Normocephalic and atraumatic.     Right Ear: External ear normal.     Left Ear: External ear normal.     Nose: Nose normal.     Mouth/Throat:     Pharynx: No oropharyngeal exudate.  Eyes:     General: No scleral icterus.       Right eye: No discharge.        Left eye: No discharge.     Conjunctiva/sclera: Conjunctivae normal.     Pupils: Pupils are equal, round, and reactive to light.  Neck:     Thyroid: No thyromegaly.     Vascular: No JVD.     Trachea: No tracheal deviation.  Cardiovascular:     Rate and Rhythm: Normal rate and regular rhythm.     Heart sounds: Normal heart sounds. No murmur heard.   No friction rub. No gallop.  Pulmonary:     Effort: Pulmonary effort is normal. No respiratory distress.     Breath sounds: Normal breath sounds. No stridor. No wheezing or rales.  Chest:     Chest wall: No tenderness.  Abdominal:     General: Bowel sounds are normal. There is no distension.     Palpations: Abdomen is soft. There is no mass.     Tenderness: There is no abdominal tenderness. There is no guarding or rebound.  Musculoskeletal:        General: No tenderness or deformity. Normal range of motion.     Cervical back: Normal range of motion and neck supple.  Lymphadenopathy:     Cervical: No cervical adenopathy.  Skin:    General: Skin is warm and dry.     Coloration: Skin is not pale.     Findings: No erythema or rash.  Neurological:     Mental Status: She is alert.     Cranial Nerves: No cranial nerve deficit.     Motor: No abnormal muscle tone.     Coordination: Coordination normal.     Deep Tendon Reflexes: Reflexes are normal and symmetric.  Psychiatric:        Behavior: Behavior normal.        Thought Content: Thought content normal.        Judgment: Judgment normal.     LABS: Recent  Results (from the past 2160 hour(s))  POCT HgB A1C     Status: Abnormal   Collection Time: 09/10/21 10:12 AM  Result Value Ref Range   Hemoglobin A1C 6.9 (A) 4.0 - 5.6 %   HbA1c POC (<> result, manual entry)     HbA1c, POC (prediabetic range)     HbA1c, POC (controlled diabetic range)    CBC w/Diff/Platelet  Status: Abnormal   Collection Time: 09/10/21 11:09 AM  Result Value Ref Range   WBC 10.5 3.4 - 10.8 x10E3/uL   RBC 4.58 3.77 - 5.28 x10E6/uL   Hemoglobin 14.2 11.1 - 15.9 g/dL   Hematocrit 42.2 34.0 - 46.6 %   MCV 92 79 - 97 fL   MCH 31.0 26.6 - 33.0 pg   MCHC 33.6 31.5 - 35.7 g/dL   RDW 12.5 11.7 - 15.4 %   Platelets 319 150 - 450 x10E3/uL   Neutrophils 70 Not Estab. %   Lymphs 22 Not Estab. %   Monocytes 7 Not Estab. %   Eos 1 Not Estab. %   Basos 0 Not Estab. %   Neutrophils Absolute 7.3 (H) 1.4 - 7.0 x10E3/uL   Lymphocytes Absolute 2.3 0.7 - 3.1 x10E3/uL   Monocytes Absolute 0.7 0.1 - 0.9 x10E3/uL   EOS (ABSOLUTE) 0.1 0.0 - 0.4 x10E3/uL   Basophils Absolute 0.0 0.0 - 0.2 x10E3/uL   Immature Granulocytes 0 Not Estab. %   Immature Grans (Abs) 0.0 0.0 - 0.1 x10E3/uL  Comprehensive metabolic panel     Status: Abnormal   Collection Time: 09/10/21 11:09 AM  Result Value Ref Range   Glucose 117 (H) 70 - 99 mg/dL   BUN 16 6 - 24 mg/dL   Creatinine, Ser 0.65 0.57 - 1.00 mg/dL   eGFR 111 >59 mL/min/1.73   BUN/Creatinine Ratio 25 (H) 9 - 23   Sodium 136 134 - 144 mmol/L   Potassium 4.3 3.5 - 5.2 mmol/L   Chloride 101 96 - 106 mmol/L   CO2 19 (L) 20 - 29 mmol/L   Calcium 9.5 8.7 - 10.2 mg/dL   Total Protein 6.6 6.0 - 8.5 g/dL   Albumin 4.7 3.8 - 4.8 g/dL   Globulin, Total 1.9 1.5 - 4.5 g/dL   Albumin/Globulin Ratio 2.5 (H) 1.2 - 2.2   Bilirubin Total 0.3 0.0 - 1.2 mg/dL   Alkaline Phosphatase 57 44 - 121 IU/L   AST 12 0 - 40 IU/L   ALT 16 0 - 32 IU/L  TSH + free T4     Status: None   Collection Time: 09/10/21 11:09 AM  Result Value Ref Range   TSH 1.290 0.450 -  4.500 uIU/mL   Free T4 1.04 0.82 - 1.77 ng/dL  Lipid Panel With LDL/HDL Ratio     Status: Abnormal   Collection Time: 09/10/21 11:09 AM  Result Value Ref Range   Cholesterol, Total 211 (H) 100 - 199 mg/dL   Triglycerides 156 (H) 0 - 149 mg/dL   HDL 46 >39 mg/dL   VLDL Cholesterol Cal 28 5 - 40 mg/dL   LDL Chol Calc (NIH) 137 (H) 0 - 99 mg/dL   LDL/HDL Ratio 3.0 0.0 - 3.2 ratio    Comment:                                     LDL/HDL Ratio                                             Men  Women                               1/2 Avg.Risk  1.0    1.5                                   Avg.Risk  3.6    3.2                                2X Avg.Risk  6.2    5.0                                3X Avg.Risk  8.0    6.1   NuSwab Vaginitis Plus (VG+)     Status: Abnormal   Collection Time: 09/10/21  3:57 PM  Result Value Ref Range   Atopobium vaginae Low - 0 Score   BVAB 2 Low - 0 Score   Megasphaera 1 Low - 0 Score    Comment: Calculate total score by adding the 3 individual bacterial vaginosis (BV) marker scores together.  Total score is interpreted as follows: Total score 0-1: Indicates the absence of BV. Total score   2: Indeterminate for BV. Additional clinical                  data should be evaluated to establish a                  diagnosis. Total score 3-6: Indicates the presence of BV. This test was developed and its performance characteristics determined by [facility].  It has not been cleared or approved by the Food and Drug Administration.    Candida albicans, NAA Negative Negative   Candida glabrata, NAA Positive (A) Negative    Comment: Published data demonstrate that up to 65% of Candida glabrata identified in cases of vaginal candidiasis have decreased susceptibility to fluconazole.    Trich vag by NAA Negative Negative   Chlamydia trachomatis, NAA Negative Negative   Neisseria gonorrhoeae, NAA Negative Negative  UA/M w/rflx Culture, Routine     Status: None   Collection  Time: 09/10/21  3:58 PM   Specimen: Urine   Urine  Result Value Ref Range   Specific Gravity, UA 1.008 1.005 - 1.030   pH, UA 6.5 5.0 - 7.5   Color, UA Yellow Yellow   Appearance Ur Clear Clear   Leukocytes,UA Negative Negative   Protein,UA Negative Negative/Trace   Glucose, UA Negative Negative   Ketones, UA Negative Negative   RBC, UA Negative Negative   Bilirubin, UA Negative Negative   Urobilinogen, Ur 0.2 0.2 - 1.0 mg/dL   Nitrite, UA Negative Negative   Microscopic Examination Comment     Comment: Microscopic follows if indicated.   Microscopic Examination See below:     Comment: Microscopic was indicated and was performed.   Urinalysis Reflex Comment     Comment: This specimen will not reflex to a Urine Culture.  Microscopic Examination     Status: None   Collection Time: 09/10/21  3:58 PM   Urine  Result Value Ref Range   WBC, UA None seen 0 - 5 /hpf   RBC None seen 0 - 2 /hpf   Epithelial Cells (non renal) None seen 0 - 10 /hpf   Casts None seen None seen /lpf   Bacteria, UA None seen None seen/Few        Assessment/Plan: 1. Encounter for general adult  medical examination with abnormal findings CPE performed, will have labs done now, mammogram ordered and Pap performed today  2. Type 2 diabetes mellitus with hyperglycemia, with long-term current use of insulin (HCC) - POCT HgB A1C is 6.9 which is greatly improved from 7.6 at last check.  We will continue metformin, glimepiride, insulin as before and patient will call insurance to find cheaper alternative to [facility] in case sugars begin to rise again and additional medication is needed.  Continue to work on diet and exercise - metFORMIN (GLUCOPHAGE-XR) 500 MG 24 hr tablet; Take 2 tablets (1,000 mg total) by mouth 2 (two) times daily.  Dispense: 360 tablet; Refill: 0 - glimepiride (AMARYL) 4 MG tablet; Take 1 tablet (4 mg total) by mouth 2 (two) times daily.  Dispense: 180 tablet; Refill: 0  3. Essential  hypertension, benign BP slightly elevated in office likely due to patient being nervous about Pap.  Advised to monitor closely at home and call office if continued elevation - hydrochlorothiazide (HYDRODIURIL) 25 MG tablet; Take 1 tablet (25 mg total) by mouth daily.  Dispense: 90 tablet; Refill: 3  4. Hypokalemia - potassium chloride (KLOR-CON) 10 MEQ tablet; Take 1 tablet (10 mEq total) by mouth daily.  Dispense: 90 tablet; Refill: 3  5. Visit for screening mammogram - MM Digital Screening; Future  6. Visit for gynecologic examination Breast and pelvic exams performed  7. Screening examination for sexually transmitted disease - NuSwab Vaginitis Plus (VG+)  8. Routine cervical smear - IGP, Aptima HPV  9. Dysuria - UA/M w/rflx Culture, Routine   General Counseling: Sandy Salaam understanding of the findings of todays visit and agrees with plan of treatment. I have discussed any further diagnostic evaluation that may be needed or ordered today. We also reviewed her medications today. she has been encouraged to call the office with any questions or concerns that should arise related to todays visit.    Counseling:    Orders Placed This Encounter  Procedures   Microscopic Examination   MM Digital Screening   NuSwab Vaginitis Plus (VG+)   UA/M w/rflx Culture, Routine   POCT HgB A1C    Meds ordered this encounter  Medications   hydrochlorothiazide (HYDRODIURIL) 25 MG tablet    Sig: Take 1 tablet (25 mg total) by mouth daily.    Dispense:  90 tablet    Refill:  3   metFORMIN (GLUCOPHAGE-XR) 500 MG 24 hr tablet    Sig: Take 2 tablets (1,000 mg total) by mouth 2 (two) times daily.    Dispense:  360 tablet    Refill:  0   potassium chloride (KLOR-CON) 10 MEQ tablet    Sig: Take 1 tablet (10 mEq total) by mouth daily.    Dispense:  90 tablet    Refill:  3   glimepiride (AMARYL) 4 MG tablet    Sig: Take 1 tablet (4 mg total) by mouth 2 (two) times daily.    Dispense:   180 tablet    Refill:  0    This patient was seen by Drema Dallas, PA-C in collaboration with Dr. Clayborn Bigness as a part of collaborative care agreement.  Total time spent:40 Minutes  Time spent includes review of chart, medications, test results, and follow up plan with the patient.     Lavera Guise, MD  Internal Medicine

## 2021-09-11 LAB — COMPREHENSIVE METABOLIC PANEL
ALT: 16 IU/L (ref 0–32)
AST: 12 IU/L (ref 0–40)
Albumin/Globulin Ratio: 2.5 — ABNORMAL HIGH (ref 1.2–2.2)
Albumin: 4.7 g/dL (ref 3.8–4.8)
Alkaline Phosphatase: 57 IU/L (ref 44–121)
BUN/Creatinine Ratio: 25 — ABNORMAL HIGH (ref 9–23)
BUN: 16 mg/dL (ref 6–24)
Bilirubin Total: 0.3 mg/dL (ref 0.0–1.2)
CO2: 19 mmol/L — ABNORMAL LOW (ref 20–29)
Calcium: 9.5 mg/dL (ref 8.7–10.2)
Chloride: 101 mmol/L (ref 96–106)
Creatinine, Ser: 0.65 mg/dL (ref 0.57–1.00)
Globulin, Total: 1.9 g/dL (ref 1.5–4.5)
Glucose: 117 mg/dL — ABNORMAL HIGH (ref 70–99)
Potassium: 4.3 mmol/L (ref 3.5–5.2)
Sodium: 136 mmol/L (ref 134–144)
Total Protein: 6.6 g/dL (ref 6.0–8.5)
eGFR: 111 mL/min/{1.73_m2} (ref >59)

## 2021-09-11 LAB — UA/M W/RFLX CULTURE, ROUTINE
Bilirubin, UA: NEGATIVE
Glucose, UA: NEGATIVE
Ketones, UA: NEGATIVE
Leukocytes,UA: NEGATIVE
Nitrite, UA: NEGATIVE
Protein,UA: NEGATIVE
RBC, UA: NEGATIVE
Specific Gravity, UA: 1.008 (ref 1.005–1.030)
Urobilinogen, Ur: 0.2 mg/dL (ref 0.2–1.0)
pH, UA: 6.5 (ref 5.0–7.5)

## 2021-09-11 LAB — CBC WITH DIFFERENTIAL/PLATELET
Basophils Absolute: 0 10*3/uL (ref 0.0–0.2)
Basos: 0 %
EOS (ABSOLUTE): 0.1 10*3/uL (ref 0.0–0.4)
Eos: 1 %
Hematocrit: 42.2 % (ref 34.0–46.6)
Hemoglobin: 14.2 g/dL (ref 11.1–15.9)
Immature Grans (Abs): 0 10*3/uL (ref 0.0–0.1)
Immature Granulocytes: 0 %
Lymphocytes Absolute: 2.3 10*3/uL (ref 0.7–3.1)
Lymphs: 22 %
MCH: 31 pg (ref 26.6–33.0)
MCHC: 33.6 g/dL (ref 31.5–35.7)
MCV: 92 fL (ref 79–97)
Monocytes Absolute: 0.7 10*3/uL (ref 0.1–0.9)
Monocytes: 7 %
Neutrophils Absolute: 7.3 10*3/uL — ABNORMAL HIGH (ref 1.4–7.0)
Neutrophils: 70 %
Platelets: 319 10*3/uL (ref 150–450)
RBC: 4.58 x10E6/uL (ref 3.77–5.28)
RDW: 12.5 % (ref 11.7–15.4)
WBC: 10.5 10*3/uL (ref 3.4–10.8)

## 2021-09-11 LAB — LIPID PANEL WITH LDL/HDL RATIO
Cholesterol, Total: 211 mg/dL — ABNORMAL HIGH (ref 100–199)
HDL: 46 mg/dL (ref >39)
LDL Chol Calc (NIH): 137 mg/dL — ABNORMAL HIGH (ref 0–99)
LDL/HDL Ratio: 3 ratio (ref 0.0–3.2)
Triglycerides: 156 mg/dL — ABNORMAL HIGH (ref 0–149)
VLDL Cholesterol Cal: 28 mg/dL (ref 5–40)

## 2021-09-11 LAB — MICROSCOPIC EXAMINATION
Bacteria, UA: NONE SEEN
Casts: NONE SEEN /lpf
Epithelial Cells (non renal): NONE SEEN /hpf (ref 0–10)
RBC, Urine: NONE SEEN /hpf (ref 0–2)
WBC, UA: NONE SEEN /hpf (ref 0–5)

## 2021-09-11 LAB — TSH+FREE T4
Free T4: 1.04 ng/dL (ref 0.82–1.77)
TSH: 1.29 u[IU]/mL (ref 0.450–4.500)

## 2021-09-13 LAB — NUSWAB VAGINITIS PLUS (VG+)
Candida albicans, NAA: NEGATIVE
Candida glabrata, NAA: POSITIVE — AB
Chlamydia trachomatis, NAA: NEGATIVE
Neisseria gonorrhoeae, NAA: NEGATIVE
Trich vag by NAA: NEGATIVE

## 2021-09-24 ENCOUNTER — Other Ambulatory Visit: Payer: Self-pay | Admitting: Physician Assistant

## 2021-09-24 ENCOUNTER — Telehealth: Payer: Self-pay

## 2021-09-24 LAB — IGP, APTIMA HPV: HPV Aptima: NEGATIVE

## 2021-09-24 MED ORDER — FLUCONAZOLE 150 MG PO TABS: 150.0000 mg | ORAL_TABLET | Freq: Once | ORAL | 0 refills | Status: AC

## 2021-09-24 NOTE — Telephone Encounter (Signed)
Spoke to pt and informed her of lab results and that lauren sent in diflucan to pharmacy for yeast infection.  Also informed pt to work on cutting out her greasy/fatty foods due to elevated cholesterol.   I also mailed a prudent diet to pt.

## 2021-10-09 ENCOUNTER — Other Ambulatory Visit: Payer: Self-pay

## 2021-10-09 DIAGNOSIS — I10 Essential (primary) hypertension: Secondary | ICD-10-CM

## 2021-10-09 MED ORDER — LISINOPRIL 40 MG PO TABS: ORAL_TABLET | ORAL | 1 refills | Status: DC

## 2021-10-12 ENCOUNTER — Telehealth: Payer: Self-pay

## 2021-10-12 DIAGNOSIS — I10 Essential (primary) hypertension: Secondary | ICD-10-CM

## 2021-10-12 MED ORDER — LISINOPRIL 40 MG PO TABS: ORAL_TABLET | ORAL | 1 refills | Status: DC

## 2021-10-12 MED ORDER — ROSUVASTATIN CALCIUM 5 MG PO TABS: 5.0000 mg | ORAL_TABLET | Freq: Every day | ORAL | 1 refills | Status: DC

## 2021-10-12 NOTE — Telephone Encounter (Signed)
Informed pt that I sent medications to her pharmacy

## 2021-12-10 ENCOUNTER — Other Ambulatory Visit: Payer: Self-pay

## 2021-12-10 ENCOUNTER — Encounter: Payer: Self-pay | Admitting: Physician Assistant

## 2021-12-10 ENCOUNTER — Ambulatory Visit (INDEPENDENT_AMBULATORY_CARE_PROVIDER_SITE_OTHER): Payer: 59 | Admitting: Physician Assistant

## 2021-12-10 VITALS — BP 138/84 | HR 86 | Temp 98.5°F | Resp 16 | Ht 67.0 in | Wt 227.0 lb

## 2021-12-10 DIAGNOSIS — I10 Essential (primary) hypertension: Secondary | ICD-10-CM | POA: Diagnosis not present

## 2021-12-10 DIAGNOSIS — E782 Mixed hyperlipidemia: Secondary | ICD-10-CM | POA: Diagnosis not present

## 2021-12-10 DIAGNOSIS — E1165 Type 2 diabetes mellitus with hyperglycemia: Secondary | ICD-10-CM

## 2021-12-10 DIAGNOSIS — E669 Obesity, unspecified: Secondary | ICD-10-CM

## 2021-12-10 DIAGNOSIS — Z794 Long term (current) use of insulin: Secondary | ICD-10-CM

## 2021-12-10 LAB — POCT GLYCOSYLATED HEMOGLOBIN (HGB A1C): Hemoglobin A1C: 6.8 % — AB (ref 4.0–5.6)

## 2021-12-10 MED ORDER — GLIMEPIRIDE 4 MG PO TABS: 4.0000 mg | ORAL_TABLET | Freq: Two times a day (BID) | ORAL | 0 refills | Status: DC

## 2021-12-10 MED ORDER — METFORMIN HCL ER 500 MG PO TB24: 1000.0000 mg | ORAL_TABLET | Freq: Two times a day (BID) | ORAL | 0 refills | Status: DC

## 2021-12-10 NOTE — Progress Notes (Signed)
[facility] [address] [place], [place] [address]  Internal MEDICINE  Office Visit Note  Patient Name: Vicki Simmons  [id number]  [id number]  Date of Service: 12/20/2021  Chief Complaint  Patient presents with   Follow-up   Diabetes   Hypertension   Quality Metric Gaps    Foot and Eye exam    HPI Patient is here for routine follow-up -BG 120-130 on avg -Not taking farxiga due to cost but continues to take her other medication -Has gained some weight over the holidays but is working on diet and exercise to lose weight again -BP not checked at home -Has been stressed a little bit, had first grandbaby born on Monday -Used to take melatonin as needed and worked well.  May try this again as needed  Current Medication: Outpatient Encounter Medications as of 12/10/2021  Medication Sig   ALPRAZolam (XANAX) 0.5 MG tablet Take 1 tablet (0.5 mg total) by mouth at bedtime as needed for anxiety.   hydrochlorothiazide (HYDRODIURIL) 25 MG tablet Take 1 tablet (25 mg total) by mouth daily.   insulin NPH Human (HUMULIN N,NOVOLIN N) 100 UNIT/ML injection Inject 60units [place] QAM and 70 units [place] QPM   lisinopril (ZESTRIL) 40 MG tablet Take 1 tablet po QD   potassium chloride (KLOR-CON) 10 MEQ tablet Take 1 tablet (10 mEq total) by mouth daily.   rosuvastatin (CRESTOR) 5 MG tablet Take 1 tablet (5 mg total) by mouth daily.   [DISCONTINUED] dapagliflozin propanediol (FARXIGA) 5 MG TABS tablet Take 1 tablet (5 mg total) by mouth daily before breakfast.   glimepiride (AMARYL) 4 MG tablet Take 1 tablet (4 mg total) by mouth 2 (two) times daily.   metFORMIN (GLUCOPHAGE-XR) 500 MG 24 hr tablet Take 2 tablets (1,000 mg total) by mouth 2 (two) times daily.   [DISCONTINUED] glimepiride (AMARYL) 4 MG tablet Take 1 tablet (4 mg total) by mouth 2 (two) times daily.   [DISCONTINUED] metFORMIN (GLUCOPHAGE-XR) 500 MG 24 hr tablet Take 2 tablets (1,000 mg total) by mouth 2 (two) times daily.   No  facility-administered encounter medications on file as of 12/10/2021.    Surgical History: Past Surgical History:  Procedure Laterality Date   CESAREAN SECTION     TUBAL LIGATION      Medical History: Past Medical History:  Diagnosis Date   Diabetes (HCC)    Hypertension     Family History: Family History  Problem Relation Age of Onset   Diabetes Mother    Hyperlipidemia Mother    Hypertension Mother    Heart attack Father    Stroke Maternal Grandmother     Social History   Socioeconomic History   Marital status: Married    Spouse name: Not on file   Number of children: Not on file   Years of education: Not on file   Highest education level: Not on file  Occupational History   Not on file  Tobacco Use   Smoking status: Every Day    Packs/day: 1.00    Types: Cigarettes   Smokeless tobacco: Never  Substance and Sexual Activity   Alcohol use: No   Drug use: No   Sexual activity: Not on file  Other Topics Concern   Not on file  Social History Narrative   Not on file   Social Determinants of Health   Financial Resource Strain: Not on file  Food Insecurity: Not on file  Transportation Needs: Not on file  Physical Activity: Not on  file  Stress: Not on file  Social Connections: Not on file  Intimate Partner Violence: Not on file      Review of Systems  Constitutional:  Negative for chills, fatigue and unexpected weight change.  HENT:  Negative for congestion, postnasal drip, rhinorrhea, sneezing and sore throat.   Eyes:  Negative for redness.  Respiratory:  Negative for cough, chest tightness and shortness of breath.   Cardiovascular:  Negative for chest pain and palpitations.  Gastrointestinal:  Negative for abdominal pain, constipation, diarrhea, nausea and vomiting.  Genitourinary:  Negative for dysuria and frequency.  Musculoskeletal:  Negative for arthralgias, back pain, joint swelling and neck pain.  Skin:  Negative for rash.  Neurological:  Negative.  Negative for tremors and numbness.  Hematological:  Negative for adenopathy. Does not bruise/bleed easily.  Psychiatric/Behavioral:  Negative for behavioral problems (Depression), sleep disturbance and suicidal ideas. The patient is not nervous/anxious.    Vital Signs: BP 138/84 Comment: 151/78   Pulse 86    Temp 98.5 F (36.9 C)    Resp 16    Ht 5\' 7"  (1.702 m)    Wt 227 lb (103 kg)    SpO2 98%    BMI 35.55 kg/m    Physical Exam Vitals and nursing note reviewed.  Constitutional:      General: She is not in acute distress.    Appearance: She is well-developed. She is obese. She is not diaphoretic.  HENT:     Head: Normocephalic and atraumatic.     Mouth/Throat:     Pharynx: No oropharyngeal exudate.  Eyes:     Pupils: Pupils are equal, round, and reactive to light.  Neck:     Thyroid: No thyromegaly.     Vascular: No JVD.     Trachea: No tracheal deviation.  Cardiovascular:     Rate and Rhythm: Normal rate and regular rhythm.     Heart sounds: Normal heart sounds. No murmur heard.   No friction rub. No gallop.  Pulmonary:     Effort: Pulmonary effort is normal. No respiratory distress.     Breath sounds: No wheezing or rales.  Chest:     Chest wall: No tenderness.  Abdominal:     General: Bowel sounds are normal.     Palpations: Abdomen is soft.  Musculoskeletal:        General: Normal range of motion.     Cervical back: Normal range of motion and neck supple.  Lymphadenopathy:     Cervical: No cervical adenopathy.  Skin:    General: Skin is warm and dry.  Neurological:     Mental Status: She is alert and oriented to person, place, and time.     Cranial Nerves: No cranial nerve deficit.  Psychiatric:        Behavior: Behavior normal.        Thought Content: Thought content normal.        Judgment: Judgment normal.       Assessment/Plan: 1. Type 2 diabetes mellitus with hyperglycemia, with long-term current use of insulin (HCC) - POCT HgB A1C 6.8  which is slightly improved from 6.9 at last check.  Continue to work on diet and exercise and will continue with current medications - metFORMIN (GLUCOPHAGE-XR) 500 MG 24 hr tablet; Take 2 tablets (1,000 mg total) by mouth 2 (two) times daily.  Dispense: 360 tablet; Refill: 0 - glimepiride (AMARYL) 4 MG tablet; Take 1 tablet (4 mg total) by mouth 2 (two) times daily.  Dispense: 180 tablet; Refill: 0  2. Essential hypertension, benign Stable, continue current medications  3. Mixed hyperlipidemia Continue Crestor  4. Obesity (BMI 30-39.9) Continue to work on diet and exercise   General Counseling: ahmani poeppelman understanding of the findings of todays visit and agrees with plan of treatment. I have discussed any further diagnostic evaluation that may be needed or ordered today. We also reviewed her medications today. she has been encouraged to call the office with any questions or concerns that should arise related to todays visit.    Orders Placed This Encounter  Procedures   POCT HgB A1C    Meds ordered this encounter  Medications   metFORMIN (GLUCOPHAGE-XR) 500 MG 24 hr tablet    Sig: Take 2 tablets (1,000 mg total) by mouth 2 (two) times daily.    Dispense:  360 tablet    Refill:  0   glimepiride (AMARYL) 4 MG tablet    Sig: Take 1 tablet (4 mg total) by mouth 2 (two) times daily.    Dispense:  180 tablet    Refill:  0    This patient was seen by Drema Dallas, PA-C in collaboration with Dr. Clayborn Bigness as a part of collaborative care agreement.   Total time spent:30 Minutes Time spent includes review of chart, medications, test results, and follow up plan with the patient.      Dr Lavera Guise Internal medicine

## 2022-01-21 ENCOUNTER — Encounter: Payer: Self-pay | Admitting: Orthopaedic Surgery

## 2022-01-21 ENCOUNTER — Ambulatory Visit (INDEPENDENT_AMBULATORY_CARE_PROVIDER_SITE_OTHER): Payer: 59

## 2022-01-21 ENCOUNTER — Ambulatory Visit: Payer: Self-pay

## 2022-01-21 ENCOUNTER — Ambulatory Visit (INDEPENDENT_AMBULATORY_CARE_PROVIDER_SITE_OTHER): Payer: 59 | Admitting: Orthopaedic Surgery

## 2022-01-21 VITALS — Ht 67.0 in | Wt 228.0 lb

## 2022-01-21 DIAGNOSIS — M25562 Pain in left knee: Secondary | ICD-10-CM

## 2022-01-21 DIAGNOSIS — M25561 Pain in right knee: Secondary | ICD-10-CM

## 2022-01-21 DIAGNOSIS — G8929 Other chronic pain: Secondary | ICD-10-CM | POA: Diagnosis not present

## 2022-01-21 NOTE — Progress Notes (Signed)
? ?Office Visit Note ?  ?Patient: Vicki Simmons           ?Date of Birth: 04/05/1976           ?MRN: [id number] ?Visit Date: 01/21/2022 ?             ?Requested by: Carlean Jews, PA-C ?[phone] ?[place],  [place] [address] ?PCP: Carlean Jews, PA-C ? ? ?Assessment & Plan: ?Visit Diagnoses:  ?1. Chronic pain of both knees   ? ? ?Plan: Patient is a pleasant 46 year old woman with right greater than left knee pain going on for quite a while.  Right is more chronic left started hurting last week.  She denies any specific injuries she denies any change in activities she is a diabetic but her most hemoglobin A1c's have been under 7.  She has had no prior treatment other than taking bare aspirin.  Her family history is positive for knee replacement in her mother.  We discussed the natural history of this she does have some mechanical signs in the lateral aspect of her right knee though these are consistent.  She does have a lot of grinding and trouble going up and down stairs.  I recommend a course of physical therapy and a daily Mobic.  She understands not to take this with other anti-inflammatories.  I offered her an injection today into the right knee however she would like to try more conservative measures first.  She will also begin using Voltaren gel ? ?Follow-Up Instructions: No follow-ups on file.  ? ?Orders:  ?Orders Placed This Encounter  ?Procedures  ? XR KNEE 3 VIEW LEFT  ? XR KNEE 3 VIEW RIGHT  ? ?No orders of the defined types were placed in this encounter. ? ? ? ? Procedures: ?No procedures performed ? ? ?Clinical Data: ?No additional findings. ? ? ?Subjective: ?Chief Complaint  ?Patient presents with  ? Left Knee - Pain  ? Right Knee - Pain  ?Patient presents today for bilateral knee pain. She said that the left has hurt for quite some time, and the left started to hurt last week. She cannot kneel on her knees. She said that they pop and grind with movement. She takes a Customer service manager for pain relief  each day. No prior knee treatment. She is diabetic. ? ? ? ?Review of Systems  ?All other systems reviewed and are negative. ? ? ?Objective: ?Vital Signs: Ht 5\' 7"  (1.702 m)   Wt 228 lb (103.4 kg)   BMI 35.71 kg/m?  ? ?Physical Exam ?Constitutional:   ?   Appearance: Normal appearance.  ?Pulmonary:  ?   Effort: Pulmonary effort is normal.  ?Neurological:  ?   General: No focal deficit present.  ?   Mental Status: She is alert.  ? ? ?Ortho Exam ?Examination of her bilateral knees she has no effusions no redness.  She has grinding with range of motion bilaterally.  On the right knee she does have some tenderness over the lateral joint line not so much on the left knee no tenderness over the medial joint line she has good varus and valgus stability lower compartments of her legs are soft and nontender ?Specialty Comments:  ?No specialty comments available. ? ?Imaging: ?No results found. ? ? ?PMFS History: ?Patient Active Problem List  ? Diagnosis Date Noted  ? Encounter for long-term (current) use of medications 04/20/2020  ? BMI 40.0-44.9, adult (HCC) 04/16/2019  ? Abnormal weight gain 04/01/2019  ? Essential hypertension,  benign 11/29/2018  ? Hypokalemia 11/29/2018  ? Generalized anxiety disorder 11/29/2018  ? Uncontrolled type 2 diabetes mellitus with hyperglycemia (HCC) 11/29/2018  ? Hypertension associated with diabetes (HCC) 06/07/2018  ? Type 2 diabetes mellitus with hyperglycemia, with long-term current use of insulin (HCC) 04/10/2018  ? Tobacco dependence 01/29/2018  ? Diabetes (HCC) 12/28/2017  ? Hypertension 12/28/2017  ? ?Past Medical History:  ?Diagnosis Date  ? Diabetes (HCC)   ? Hypertension   ?  ?Family History  ?Problem Relation Age of Onset  ? Diabetes Mother   ? Hyperlipidemia Mother   ? Hypertension Mother   ? Heart attack Father   ? Stroke Maternal Grandmother   ?  ?Past Surgical History:  ?Procedure Laterality Date  ? CESAREAN SECTION    ? TUBAL LIGATION    ? ?Social History  ? ?Occupational  History  ? Not on file  ?Tobacco Use  ? Smoking status: Every Day  ?  Packs/day: 1.00  ?  Types: Cigarettes  ? Smokeless tobacco: Never  ?Substance and Sexual Activity  ? Alcohol use: No  ? Drug use: No  ? Sexual activity: Not on file  ? ? ? ? ? ? ?

## 2022-02-10 ENCOUNTER — Ambulatory Visit: Payer: 59 | Attending: Orthopaedic Surgery | Admitting: Physical Therapy

## 2022-02-10 NOTE — Patient Instructions (Incomplete)
Bilateral knee pain

## 2022-02-15 ENCOUNTER — Encounter: Payer: 59 | Admitting: Physical Therapy

## 2022-02-17 ENCOUNTER — Encounter: Payer: 59 | Admitting: Physical Therapy

## 2022-02-18 ENCOUNTER — Ambulatory Visit: Payer: 59 | Admitting: Orthopaedic Surgery

## 2022-02-22 ENCOUNTER — Encounter: Payer: 59 | Admitting: Physical Therapy

## 2022-02-24 ENCOUNTER — Encounter: Payer: 59 | Admitting: Physical Therapy

## 2022-03-01 ENCOUNTER — Encounter: Payer: 59 | Admitting: Physical Therapy

## 2022-03-03 ENCOUNTER — Encounter: Payer: 59 | Admitting: Physical Therapy

## 2022-03-05 ENCOUNTER — Other Ambulatory Visit: Payer: Self-pay | Admitting: Physician Assistant

## 2022-03-05 DIAGNOSIS — I10 Essential (primary) hypertension: Secondary | ICD-10-CM

## 2022-03-05 DIAGNOSIS — E1165 Type 2 diabetes mellitus with hyperglycemia: Secondary | ICD-10-CM

## 2022-03-08 ENCOUNTER — Encounter: Payer: 59 | Admitting: Physical Therapy

## 2022-03-11 ENCOUNTER — Ambulatory Visit: Payer: 59 | Admitting: Physician Assistant

## 2022-03-12 ENCOUNTER — Encounter: Payer: Self-pay | Admitting: Physician Assistant

## 2022-03-12 ENCOUNTER — Ambulatory Visit (INDEPENDENT_AMBULATORY_CARE_PROVIDER_SITE_OTHER): Payer: 59 | Admitting: Physician Assistant

## 2022-03-12 VITALS — BP 130/70 | HR 86 | Temp 97.8°F | Resp 16 | Ht 67.0 in | Wt 230.4 lb

## 2022-03-12 DIAGNOSIS — Z794 Long term (current) use of insulin: Secondary | ICD-10-CM

## 2022-03-12 DIAGNOSIS — E669 Obesity, unspecified: Secondary | ICD-10-CM

## 2022-03-12 DIAGNOSIS — E1165 Type 2 diabetes mellitus with hyperglycemia: Secondary | ICD-10-CM | POA: Diagnosis not present

## 2022-03-12 DIAGNOSIS — I10 Essential (primary) hypertension: Secondary | ICD-10-CM

## 2022-03-12 LAB — POCT GLYCOSYLATED HEMOGLOBIN (HGB A1C): Hemoglobin A1C: 7.5 % — AB (ref 4.0–5.6)

## 2022-03-12 MED ORDER — TIRZEPATIDE 2.5 MG/0.5ML ~~LOC~~ SOAJ: 2.5000 mg | SUBCUTANEOUS | 0 refills | Status: DC

## 2022-03-12 NOTE — Progress Notes (Signed)
[facility] [place], [place] [address]  Internal MEDICINE  Office Visit Note  Patient Name: Vicki Simmons  [id number]  [id number]  Date of Service: 03/23/2022  Chief Complaint  Patient presents with   Follow-up   Diabetes   Hypertension   Quality Metric Gaps    Foot Exam    HPI Pt is here for routine follow up -BG 113-220, has been fluctuating -Diet may have slipped some recently, because she has been taking all her medication as before yet A1c rising again -she takes glimepiride BID, insulin 60units in Am and 70units at night, and metformin 1000 BID. Unfortunately Wilder Glade was too costly and unsure what medications are better covered on her insurance. Will try sending mounjaro to see if this is covered as it would greatly help with BG and wt loss goals.  Current Medication: Outpatient Encounter Medications as of 03/12/2022  Medication Sig   ALPRAZolam (XANAX) 0.5 MG tablet Take 1 tablet (0.5 mg total) by mouth at bedtime as needed for anxiety.   glimepiride (AMARYL) 4 MG tablet Take 1 tablet by mouth twice daily   hydrochlorothiazide (HYDRODIURIL) 25 MG tablet Take 1 tablet (25 mg total) by mouth daily.   insulin NPH Human (HUMULIN N,NOVOLIN N) 100 UNIT/ML injection Inject 60units [place] QAM and 70 units [place] QPM   lisinopril (ZESTRIL) 40 MG tablet Take 1 tablet by mouth once daily   potassium chloride (KLOR-CON) 10 MEQ tablet Take 1 tablet (10 mEq total) by mouth daily.   rosuvastatin (CRESTOR) 5 MG tablet Take 1 tablet by mouth once daily   tirzepatide (MOUNJARO) 2.5 MG/0.5ML Pen Inject 2.5 mg into the skin once a week.   metFORMIN (GLUCOPHAGE-XR) 500 MG 24 hr tablet Take 2 tablets (1,000 mg total) by mouth 2 (two) times daily.   No facility-administered encounter medications on file as of 03/12/2022.    Surgical History: Past Surgical History:  Procedure Laterality Date   CESAREAN SECTION     TUBAL LIGATION      Medical History: Past Medical  History:  Diagnosis Date   Diabetes ([place])    Hypertension     Family History: Family History  Problem Relation Age of Onset   Diabetes Mother    Hyperlipidemia Mother    Hypertension Mother    Heart attack Father    Stroke Maternal Grandmother     Social History   Socioeconomic History   Marital status: Married    Spouse name: Not on file   Number of children: Not on file   Years of education: Not on file   Highest education level: Not on file  Occupational History   Not on file  Tobacco Use   Smoking status: Every Day    Packs/day: 1.00    Types: Cigarettes   Smokeless tobacco: Never  Substance and Sexual Activity   Alcohol use: No   Drug use: No   Sexual activity: Not on file  Other Topics Concern   Not on file  Social History Narrative   Not on file   Social Determinants of Health   Financial Resource Strain: Not on file  Food Insecurity: Not on file  Transportation Needs: Not on file  Physical Activity: Not on file  Stress: Not on file  Social Connections: Not on file  Intimate Partner Violence: Not on file      Review of Systems  Constitutional:  Negative for chills, fatigue and unexpected weight change.  HENT:  Negative for congestion,  postnasal drip, rhinorrhea, sneezing and sore throat.   Eyes:  Negative for redness.  Respiratory:  Negative for cough, chest tightness and shortness of breath.   Cardiovascular:  Negative for chest pain and palpitations.  Gastrointestinal:  Negative for abdominal pain, constipation, diarrhea, nausea and vomiting.  Genitourinary:  Negative for dysuria and frequency.  Musculoskeletal:  Negative for arthralgias, back pain, joint swelling and neck pain.  Skin:  Negative for rash.  Neurological: Negative.  Negative for tremors and numbness.  Hematological:  Negative for adenopathy. Does not bruise/bleed easily.  Psychiatric/Behavioral:  Negative for behavioral problems (Depression), sleep disturbance and suicidal  ideas. The patient is not nervous/anxious.    Vital Signs: BP 130/70   Pulse 86   Temp 97.8 F (36.6 C)   Resp 16   Ht 5\' 7"  (1.702 m)   Wt 230 lb 6.4 oz (104.5 kg)   SpO2 98%   BMI 36.09 kg/m    Physical Exam Vitals and nursing note reviewed.  Constitutional:      General: She is not in acute distress.    Appearance: She is well-developed. She is obese. She is not diaphoretic.  HENT:     Head: Normocephalic and atraumatic.     Mouth/Throat:     Pharynx: No oropharyngeal exudate.  Eyes:     Pupils: Pupils are equal, round, and reactive to light.  Neck:     Thyroid: No thyromegaly.     Vascular: No JVD.     Trachea: No tracheal deviation.  Cardiovascular:     Rate and Rhythm: Normal rate and regular rhythm.     Heart sounds: Normal heart sounds. No murmur heard.   No friction rub. No gallop.  Pulmonary:     Effort: Pulmonary effort is normal. No respiratory distress.     Breath sounds: No wheezing or rales.  Chest:     Chest wall: No tenderness.  Abdominal:     General: Bowel sounds are normal.     Palpations: Abdomen is soft.  Musculoskeletal:        General: Normal range of motion.     Cervical back: Normal range of motion and neck supple.  Lymphadenopathy:     Cervical: No cervical adenopathy.  Skin:    General: Skin is warm and dry.  Neurological:     Mental Status: She is alert and oriented to person, place, and time.     Cranial Nerves: No cranial nerve deficit.  Psychiatric:        Behavior: Behavior normal.        Thought Content: Thought content normal.        Judgment: Judgment normal.       Assessment/Plan: 1. Type 2 diabetes mellitus with hyperglycemia, with long-term current use of insulin (HCC) - POCT HgB A1C is 7.5 which is increased from 6.8 last visit. Will continue current medications and add mounjaro. Will likely need to titrate up. Also discussed improving diet and exercise. - tirzepatide [facility]) 2.5 MG/0.5ML Pen; Inject 2.5 mg  into the skin once a week.  Dispense: 2 mL; Refill: 0  2. Essential hypertension, benign Stable, continue current medications  3. Obesity (BMI 30-39.9) Will continue to work on diet and exercise and will add mounjaro to aid BG control and weight loss benefits.   General Counseling: hannahmae schmoldt understanding of the findings of todays visit and agrees with plan of treatment. I have discussed any further diagnostic evaluation that may be needed or ordered today. We also  reviewed her medications today. she has been encouraged to call the office with any questions or concerns that should arise related to todays visit.    Orders Placed This Encounter  Procedures   POCT HgB A1C    Meds ordered this encounter  Medications   tirzepatide (MOUNJARO) 2.5 MG/0.5ML Pen    Sig: Inject 2.5 mg into the skin once a week.    Dispense:  2 mL    Refill:  0    This patient was seen by Drema Dallas, PA-C in collaboration with Dr. Clayborn Bigness as a part of collaborative care agreement.   Total time spent:30 Minutes Time spent includes review of chart, medications, test results, and follow up plan with the patient.      Dr Lavera Guise Internal medicine

## 2022-03-15 ENCOUNTER — Encounter: Payer: 59 | Admitting: Physical Therapy

## 2022-03-17 ENCOUNTER — Encounter: Payer: 59 | Admitting: Physical Therapy

## 2022-03-18 ENCOUNTER — Ambulatory Visit: Payer: Self-pay | Admitting: Podiatry

## 2022-03-22 ENCOUNTER — Telehealth: Payer: Self-pay

## 2022-03-22 NOTE — Telephone Encounter (Signed)
PA for [facility] 2.5 mg sent 03/22/22 @ 249 am

## 2022-04-06 ENCOUNTER — Other Ambulatory Visit: Payer: Self-pay | Admitting: Physician Assistant

## 2022-04-06 DIAGNOSIS — Z794 Long term (current) use of insulin: Secondary | ICD-10-CM

## 2022-06-10 ENCOUNTER — Ambulatory Visit (INDEPENDENT_AMBULATORY_CARE_PROVIDER_SITE_OTHER): Payer: 59 | Admitting: Podiatry

## 2022-06-10 ENCOUNTER — Ambulatory Visit (INDEPENDENT_AMBULATORY_CARE_PROVIDER_SITE_OTHER): Payer: 59

## 2022-06-10 DIAGNOSIS — M722 Plantar fascial fibromatosis: Secondary | ICD-10-CM | POA: Diagnosis not present

## 2022-06-10 NOTE — Progress Notes (Signed)
Subjective:  Patient ID: Vicki Simmons, female    DOB: 05/03/1976,  MRN: [id number]  Chief Complaint  Patient presents with   Foot Pain    Right foot pain    46 y.o. female presents with the above complaint.  Patient presents with right heel pain that has been on for quite some time is progressive gotten worse hurts with ambulation.  She would like to get it evaluated she has not seen anyone else prior to seeing me.  Hurts with pressure.  Pain scale 7 out of 10.  Dull achy in nature.  She has had this in the past.   Review of Systems: Negative except as noted in the HPI. Denies N/V/F/Ch.  Past Medical History:  Diagnosis Date   Diabetes (HCC)    Hypertension     Current Outpatient Medications:    ALPRAZolam (XANAX) 0.5 MG tablet, Take 1 tablet (0.5 mg total) by mouth at bedtime as needed for anxiety., Disp: 30 tablet, Rfl: 0   glimepiride (AMARYL) 4 MG tablet, Take 1 tablet by mouth twice daily, Disp: 180 tablet, Rfl: 0   hydrochlorothiazide (HYDRODIURIL) 25 MG tablet, Take 1 tablet (25 mg total) by mouth daily., Disp: 90 tablet, Rfl: 3   insulin NPH Human (HUMULIN N,NOVOLIN N) 100 UNIT/ML injection, Inject 60units [place] QAM and 70 units [place] QPM, Disp: 10 mL, Rfl: 0   lisinopril (ZESTRIL) 40 MG tablet, Take 1 tablet by mouth once daily, Disp: 90 tablet, Rfl: 0   metFORMIN (GLUCOPHAGE-XR) 500 MG 24 hr tablet, Take 2 tablets by mouth twice daily, Disp: 360 tablet, Rfl: 0   potassium chloride (KLOR-CON) 10 MEQ tablet, Take 1 tablet (10 mEq total) by mouth daily., Disp: 90 tablet, Rfl: 3   rosuvastatin (CRESTOR) 5 MG tablet, Take 1 tablet by mouth once daily, Disp: 90 tablet, Rfl: 0   tirzepatide (MOUNJARO) 2.5 MG/0.5ML Pen, Inject 2.5 mg into the skin once a week., Disp: 2 mL, Rfl: 0  Social History   Tobacco Use  Smoking Status Every Day   Packs/day: 1.00   Types: Cigarettes  Smokeless Tobacco Never    No Known Allergies Objective:  There were no vitals filed for this  visit. There is no height or weight on file to calculate BMI. Constitutional Well developed. Well nourished.  Vascular Dorsalis pedis pulses palpable bilaterally. Posterior tibial pulses palpable bilaterally. Capillary refill normal to all digits.  No cyanosis or clubbing noted. Pedal hair growth normal.  Neurologic Normal speech. Oriented to person, place, and time. Epicritic sensation to light touch grossly present bilaterally.  Dermatologic Nails well groomed and normal in appearance. No open wounds. No skin lesions.  Orthopedic: Normal joint ROM without pain or crepitus bilaterally. No visible deformities. Tender to palpation at the calcaneal tuber right. No pain with calcaneal squeeze right. Ankle ROM diminished range of motion right. Silf[place]kiold Test: positive bilaterally.   Radiographs: Taken and reviewed. No acute fractures or dislocations. No evidence of stress fracture.  Plantar heel spur present. Posterior heel spur present.  Pes planovalgus foot structure  Assessment:   1. Plantar fasciitis of right foot    Plan:  Patient was evaluated and treated and all questions answered.  Plantar Fasciitis, right - XR reviewed as above.  - Educated on icing and stretching. Instructions given.  - Injection delivered to the plantar fascia as below. - DME: Plantar fascial brace dispensed to support the medial longitudinal arch of the foot and offload pressure from the heel and prevent arch  collapse during weightbearing - Pharmacologic management: None  Procedure: Injection Tendon/Ligament Location: Right plantar fascia at the glabrous junction; medial approach. Skin Prep: alcohol Injectate: 0.5 cc 0.5% marcaine plain, 0.5 cc of 1% Lidocaine, 0.5 cc kenalog 10. Disposition: Patient tolerated procedure well. Injection site dressed with a band-aid.  No follow-ups on file.Right PF injection and brace

## 2022-06-11 ENCOUNTER — Encounter: Payer: Self-pay | Admitting: Physician Assistant

## 2022-06-11 ENCOUNTER — Ambulatory Visit (INDEPENDENT_AMBULATORY_CARE_PROVIDER_SITE_OTHER): Payer: 59 | Admitting: Physician Assistant

## 2022-06-11 VITALS — BP 120/70 | HR 75 | Temp 98.0°F | Resp 16 | Ht 67.0 in | Wt 226.0 lb

## 2022-06-11 DIAGNOSIS — R5383 Other fatigue: Secondary | ICD-10-CM

## 2022-06-11 DIAGNOSIS — I10 Essential (primary) hypertension: Secondary | ICD-10-CM

## 2022-06-11 DIAGNOSIS — E1165 Type 2 diabetes mellitus with hyperglycemia: Secondary | ICD-10-CM

## 2022-06-11 DIAGNOSIS — Z794 Long term (current) use of insulin: Secondary | ICD-10-CM

## 2022-06-11 DIAGNOSIS — E782 Mixed hyperlipidemia: Secondary | ICD-10-CM | POA: Diagnosis not present

## 2022-06-11 DIAGNOSIS — R946 Abnormal results of thyroid function studies: Secondary | ICD-10-CM

## 2022-06-11 LAB — POCT GLYCOSYLATED HEMOGLOBIN (HGB A1C): Hemoglobin A1C: 6.5 % — AB (ref 4.0–5.6)

## 2022-06-11 MED ORDER — HYDROCHLOROTHIAZIDE 25 MG PO TABS: 25.0000 mg | ORAL_TABLET | Freq: Every day | ORAL | 3 refills | Status: DC

## 2022-06-11 MED ORDER — METFORMIN HCL ER 500 MG PO TB24: 1000.0000 mg | ORAL_TABLET | Freq: Two times a day (BID) | ORAL | 1 refills | Status: DC

## 2022-06-11 MED ORDER — INSULIN NPH (HUMAN) (ISOPHANE) 100 UNIT/ML ~~LOC~~ SUSP: SUBCUTANEOUS | 2 refills | Status: AC

## 2022-06-11 MED ORDER — ROSUVASTATIN CALCIUM 5 MG PO TABS: 5.0000 mg | ORAL_TABLET | Freq: Every day | ORAL | 1 refills | Status: DC

## 2022-06-11 MED ORDER — LISINOPRIL 40 MG PO TABS: ORAL_TABLET | ORAL | 1 refills | Status: DC

## 2022-06-11 MED ORDER — GLIMEPIRIDE 4 MG PO TABS: 4.0000 mg | ORAL_TABLET | Freq: Two times a day (BID) | ORAL | 1 refills | Status: DC

## 2022-06-11 NOTE — Progress Notes (Signed)
[facility] [address] [place], [place] [address]  Internal MEDICINE  Office Visit Note  Patient Name: Vicki Simmons  [id number]  [id number]  Date of Service: 06/11/2022  Chief Complaint  Patient presents with   Follow-up   Diabetes   Hypertension    HPI Pt is here for routine follow up -unfortunately, mounjaro not covered by insurance so did not start on this -Really changed her diet and cut out late night snacks and sugar and is drinking more water. She was upset at her A1c last visit and it motivated her to make drastic changes to get back on track. -BG 90-130, except this morning was 202 due to receiving a cortisone injection in foot yesterday that likely drove that up -Down 4 lbs since last visit -Due for refills -Due for fasting labs prior to CPE next visit  Current Medication: Outpatient Encounter Medications as of 06/11/2022  Medication Sig   ALPRAZolam (XANAX) 0.5 MG tablet Take 1 tablet (0.5 mg total) by mouth at bedtime as needed for anxiety.   potassium chloride (KLOR-CON) 10 MEQ tablet Take 1 tablet (10 mEq total) by mouth daily.   [DISCONTINUED] glimepiride (AMARYL) 4 MG tablet Take 1 tablet by mouth twice daily   [DISCONTINUED] hydrochlorothiazide (HYDRODIURIL) 25 MG tablet Take 1 tablet (25 mg total) by mouth daily.   [DISCONTINUED] insulin NPH Human (HUMULIN N,NOVOLIN N) 100 UNIT/ML injection Inject 60units [place] QAM and 70 units [place] QPM   [DISCONTINUED] lisinopril (ZESTRIL) 40 MG tablet Take 1 tablet by mouth once daily   [DISCONTINUED] metFORMIN (GLUCOPHAGE-XR) 500 MG 24 hr tablet Take 2 tablets by mouth twice daily   [DISCONTINUED] rosuvastatin (CRESTOR) 5 MG tablet Take 1 tablet by mouth once daily   [DISCONTINUED] tirzepatide (MOUNJARO) 2.5 MG/0.5ML Pen Inject 2.5 mg into the skin once a week.   glimepiride (AMARYL) 4 MG tablet Take 1 tablet (4 mg total) by mouth 2 (two) times daily.   hydrochlorothiazide (HYDRODIURIL) 25 MG tablet Take 1 tablet  (25 mg total) by mouth daily.   insulin NPH Human (NOVOLIN N) 100 UNIT/ML injection Inject 60units [place] QAM and 70 units [place] QPM   lisinopril (ZESTRIL) 40 MG tablet Take 1 tablet by mouth once daily   metFORMIN (GLUCOPHAGE-XR) 500 MG 24 hr tablet Take 2 tablets (1,000 mg total) by mouth 2 (two) times daily.   rosuvastatin (CRESTOR) 5 MG tablet Take 1 tablet (5 mg total) by mouth daily.   No facility-administered encounter medications on file as of 06/11/2022.    Surgical History: Past Surgical History:  Procedure Laterality Date   CESAREAN SECTION     TUBAL LIGATION      Medical History: Past Medical History:  Diagnosis Date   Diabetes (HCC)    Hypertension     Family History: Family History  Problem Relation Age of Onset   Diabetes Mother    Hyperlipidemia Mother    Hypertension Mother    Heart attack Father    Stroke Maternal Grandmother     Social History   Socioeconomic History   Marital status: Married    Spouse name: Not on file   Number of children: Not on file   Years of education: Not on file   Highest education level: Not on file  Occupational History   Not on file  Tobacco Use   Smoking status: Every Day    Packs/day: 1.00    Types: Cigarettes   Smokeless tobacco: Never  Substance and Sexual Activity  Alcohol use: No   Drug use: No   Sexual activity: Not on file  Other Topics Concern   Not on file  Social History Narrative   Not on file   Social Determinants of Health   Financial Resource Strain: Not on file  Food Insecurity: Not on file  Transportation Needs: Not on file  Physical Activity: Not on file  Stress: Not on file  Social Connections: Not on file  Intimate Partner Violence: Not on file      Review of Systems  Constitutional:  Negative for chills, fatigue and unexpected weight change.  HENT:  Negative for congestion, postnasal drip, rhinorrhea, sneezing and sore throat.   Eyes:  Negative for redness.  Respiratory:  Negative  for cough, chest tightness and shortness of breath.   Cardiovascular:  Negative for chest pain and palpitations.  Gastrointestinal:  Negative for abdominal pain, constipation, diarrhea, nausea and vomiting.  Genitourinary:  Negative for dysuria and frequency.  Musculoskeletal:  Negative for arthralgias, back pain, joint swelling and neck pain.  Skin:  Negative for rash.  Neurological: Negative.  Negative for tremors and numbness.  Hematological:  Negative for adenopathy. Does not bruise/bleed easily.  Psychiatric/Behavioral:  Negative for behavioral problems (Depression), sleep disturbance and suicidal ideas. The patient is not nervous/anxious.     Vital Signs: BP 120/70   Pulse 75   Temp 98 F (36.7 C)   Resp 16   Ht 5\' 7"  (1.702 m)   Wt 226 lb (102.5 kg)   SpO2 97%   BMI 35.40 kg/m    Physical Exam Vitals and nursing note reviewed.  Constitutional:      General: She is not in acute distress.    Appearance: She is well-developed. She is obese. She is not diaphoretic.  HENT:     Head: Normocephalic and atraumatic.     Mouth/Throat:     Pharynx: No oropharyngeal exudate.  Eyes:     Pupils: Pupils are equal, round, and reactive to light.  Neck:     Thyroid: No thyromegaly.     Vascular: No JVD.     Trachea: No tracheal deviation.  Cardiovascular:     Rate and Rhythm: Normal rate and regular rhythm.     Heart sounds: Normal heart sounds. No murmur heard.    No friction rub. No gallop.  Pulmonary:     Effort: Pulmonary effort is normal. No respiratory distress.     Breath sounds: No wheezing or rales.  Chest:     Chest wall: No tenderness.  Abdominal:     General: Bowel sounds are normal.     Palpations: Abdomen is soft.  Musculoskeletal:        General: Normal range of motion.     Cervical back: Normal range of motion and neck supple.  Lymphadenopathy:     Cervical: No cervical adenopathy.  Skin:    General: Skin is warm and dry.  Neurological:     Mental  Status: She is alert and oriented to person, place, and time.     Cranial Nerves: No cranial nerve deficit.  Psychiatric:        Behavior: Behavior normal.        Thought Content: Thought content normal.        Judgment: Judgment normal.        Assessment/Plan: 1. Type 2 diabetes mellitus with hyperglycemia, with long-term current use of insulin (HCC) - POCT HgB A1C is 6.5 which is greatly improved from 7.5 last  visit. Applauded for her success and encouraged to keep this up and will keep medications the same. - glimepiride (AMARYL) 4 MG tablet; Take 1 tablet (4 mg total) by mouth 2 (two) times daily.  Dispense: 180 tablet; Refill: 1 - insulin NPH Human (NOVOLIN N) 100 UNIT/ML injection; Inject 60units [place] QAM and 70 units [place] QPM  Dispense: 10 mL; Refill: 2 - metFORMIN (GLUCOPHAGE-XR) 500 MG 24 hr tablet; Take 2 tablets (1,000 mg total) by mouth 2 (two) times daily.  Dispense: 360 tablet; Refill: 1  2. Essential hypertension, benign Controlled and will continue current medications - hydrochlorothiazide (HYDRODIURIL) 25 MG tablet; Take 1 tablet (25 mg total) by mouth daily.  Dispense: 90 tablet; Refill: 3 - lisinopril (ZESTRIL) 40 MG tablet; Take 1 tablet by mouth once daily  Dispense: 90 tablet; Refill: 1  3. Mixed hyperlipidemia Continue crestor and will update labs - Lipid Panel With LDL/HDL Ratio  4. Abnormal thyroid exam - TSH + free T4  5. Other fatigue - CBC w/Diff/Platelet - Comprehensive metabolic panel - TSH + free T4 - Lipid Panel With LDL/HDL Ratio   General Counseling: Javonda verbalizes understanding of the findings of todays visit and agrees with plan of treatment. I have discussed any further diagnostic evaluation that may be needed or ordered today. We also reviewed her medications today. she has been encouraged to call the office with any questions or concerns that should arise related to todays visit.    Orders Placed This Encounter  Procedures   CBC  w/Diff/Platelet   Comprehensive metabolic panel   TSH + free T4   Lipid Panel With LDL/HDL Ratio   POCT HgB A1C    Meds ordered this encounter  Medications   glimepiride (AMARYL) 4 MG tablet    Sig: Take 1 tablet (4 mg total) by mouth 2 (two) times daily.    Dispense:  180 tablet    Refill:  1   hydrochlorothiazide (HYDRODIURIL) 25 MG tablet    Sig: Take 1 tablet (25 mg total) by mouth daily.    Dispense:  90 tablet    Refill:  3   insulin NPH Human (NOVOLIN N) 100 UNIT/ML injection    Sig: Inject 60units [place] QAM and 70 units  QPM    Dispense:  10 mL    Refill:  2    Over the counter   lisinopril (ZESTRIL) 40 MG tablet    Sig: Take 1 tablet by mouth once daily    Dispense:  90 tablet    Refill:  1   metFORMIN (GLUCOPHAGE-XR) 500 MG 24 hr tablet    Sig: Take 2 tablets (1,000 mg total) by mouth 2 (two) times daily.    Dispense:  360 tablet    Refill:  1   rosuvastatin (CRESTOR) 5 MG tablet    Sig: Take 1 tablet (5 mg total) by mouth daily.    Dispense:  90 tablet    Refill:  1    This patient was seen by Drema Dallas, PA-C in collaboration with Dr. Clayborn Bigness as a part of collaborative care agreement.   Total time spent:30 Minutes Time spent includes review of chart, medications, test results, and follow up plan with the patient.      Dr Lavera Guise Internal medicine

## 2022-07-08 ENCOUNTER — Ambulatory Visit: Payer: Self-pay | Admitting: Podiatry

## 2022-07-22 ENCOUNTER — Ambulatory Visit: Payer: Self-pay | Admitting: Podiatry

## 2022-07-27 ENCOUNTER — Ambulatory Visit: Payer: Self-pay | Admitting: Podiatry

## 2022-08-11 ENCOUNTER — Ambulatory Visit (INDEPENDENT_AMBULATORY_CARE_PROVIDER_SITE_OTHER): Payer: BLUE CROSS/BLUE SHIELD | Admitting: Nurse Practitioner

## 2022-08-11 ENCOUNTER — Encounter: Payer: Self-pay | Admitting: Nurse Practitioner

## 2022-08-11 VITALS — BP 130/84 | HR 95 | Temp 96.7°F | Resp 16 | Ht 67.0 in | Wt 229.2 lb

## 2022-08-11 DIAGNOSIS — F411 Generalized anxiety disorder: Secondary | ICD-10-CM

## 2022-08-11 DIAGNOSIS — I10 Essential (primary) hypertension: Secondary | ICD-10-CM

## 2022-08-11 DIAGNOSIS — R079 Chest pain, unspecified: Secondary | ICD-10-CM | POA: Diagnosis not present

## 2022-08-11 MED ORDER — ALPRAZOLAM 0.5 MG PO TABS: 0.5000 mg | ORAL_TABLET | Freq: Every day | ORAL | 0 refills | Status: DC | PRN

## 2022-08-11 NOTE — Progress Notes (Signed)
[facility] [place], [place] [address]  Internal MEDICINE  Office Visit Note  Patient Name: Vicki Simmons  [id number]  [id number]  Date of Service: 08/11/2022  Chief Complaint  Patient presents with   Acute Visit    High bp and off and on chest discomfort.      HPI Hazle presents for an acute sick visit for elevated blood pressure.  --increase psychosocial stressors. --reports increased anxiety --chest discomfort, EKG done, was normal.   Current Medication:  Outpatient Encounter Medications as of 08/11/2022  Medication Sig   glimepiride (AMARYL) 4 MG tablet Take 1 tablet (4 mg total) by mouth 2 (two) times daily.   hydrochlorothiazide (HYDRODIURIL) 25 MG tablet Take 1 tablet (25 mg total) by mouth daily.   insulin NPH Human (NOVOLIN N) 100 UNIT/ML injection Inject 60units [place] QAM and 70 units [place] QPM   lisinopril (ZESTRIL) 40 MG tablet Take 1 tablet by mouth once daily   metFORMIN (GLUCOPHAGE-XR) 500 MG 24 hr tablet Take 2 tablets (1,000 mg total) by mouth 2 (two) times daily.   potassium chloride (KLOR-CON) 10 MEQ tablet Take 1 tablet (10 mEq total) by mouth daily.   rosuvastatin (CRESTOR) 5 MG tablet Take 1 tablet (5 mg total) by mouth daily.   [DISCONTINUED] ALPRAZolam (XANAX) 0.5 MG tablet Take 1 tablet (0.5 mg total) by mouth at bedtime as needed for anxiety.   ALPRAZolam (XANAX) 0.5 MG tablet Take 1 tablet (0.5 mg total) by mouth daily as needed (moderate to severe anxiety).   No facility-administered encounter medications on file as of 08/11/2022.      Medical History: Past Medical History:  Diagnosis Date   Diabetes ([place])    Hypertension      Vital Signs: BP 130/84 Comment: 160/76  Pulse 95   Temp (!) 96.7 F (35.9 C)   Resp 16   Ht 5\' 7"  (1.702 m)   Wt 229 lb 3.2 oz (104 kg)   SpO2 97%   BMI 35.90 kg/m    Review of Systems  Constitutional:  Negative for chills, fatigue and unexpected weight change.  HENT:  Negative  for congestion, postnasal drip, rhinorrhea, sneezing and sore throat.   Eyes:  Negative for redness.  Respiratory:  Negative for cough, chest tightness and shortness of breath.   Cardiovascular:  Negative for chest pain and palpitations.  Gastrointestinal: Negative.  Negative for abdominal pain, constipation, diarrhea, nausea and vomiting.  Genitourinary:  Negative for dysuria and frequency.  Musculoskeletal: Negative.  Negative for arthralgias, back pain, joint swelling and neck pain.  Skin:  Negative for rash.  Neurological: Negative.  Negative for tremors and numbness.  Hematological:  Negative for adenopathy. Does not bruise/bleed easily.  Psychiatric/Behavioral:  Negative for behavioral problems (Depression), self-injury, sleep disturbance and suicidal ideas. The patient is nervous/anxious.     Physical Exam Vitals reviewed.  Constitutional:      General: She is not in acute distress.    Appearance: Normal appearance. She is obese. She is not ill-appearing.  HENT:     Head: Normocephalic and atraumatic.  Eyes:     Pupils: Pupils are equal, round, and reactive to light.  Cardiovascular:     Rate and Rhythm: Normal rate and regular rhythm.  Pulmonary:     Effort: Pulmonary effort is normal. No respiratory distress.  Neurological:     Mental Status: She is alert and oriented to person, place, and time.  Psychiatric:  Mood and Affect: Mood normal.        Behavior: Behavior normal.       Assessment/Plan: 1. Essential hypertension, benign Improved when rechecked, most likely elevated due to stress.   2. Chest pain, unspecified type Normal EKG, discomfort most likely due to anxiety and stress - EKG 12-Lead  3. Generalized anxiety disorder Prn alprazolam prescribed as before. Stressed to patient to only use prn for mod-severe anxiety. If she needs something more regularly or on a daily basis then will need to come in to discuss and start something else for anxiety such  as sertraline or venlafaxine. - ALPRAZolam (XANAX) 0.5 MG tablet; Take 1 tablet (0.5 mg total) by mouth daily as needed (moderate to severe anxiety).  Dispense: 30 tablet; Refill: 0   General Counseling: [place] verbalizes understanding of the findings of todays visit and agrees with plan of treatment. I have discussed any further diagnostic evaluation that may be needed or ordered today. We also reviewed her medications today. she has been encouraged to call the office with any questions or concerns that should arise related to todays visit.    Counseling:    Orders Placed This Encounter  Procedures   EKG 12-Lead    Meds ordered this encounter  Medications   ALPRAZolam (XANAX) 0.5 MG tablet    Sig: Take 1 tablet (0.5 mg total) by mouth daily as needed (moderate to severe anxiety).    Dispense:  30 tablet    Refill:  0    Please fill today, new script    Return if symptoms worsen or fail to improve.  [place] Controlled Substance Database was reviewed by me for overdose risk score (ORS)  Time spent:30 Minutes Time spent with patient included reviewing progress notes, labs, imaging studies, and discussing plan for follow up.   This patient was seen by Sallyanne Kuster, FNP-C in collaboration with Dr. Beverely Risen as a part of collaborative care agreement.  Linzee Depaul R. Tedd Sias, MSN, FNP-C Internal Medicine

## 2022-09-03 ENCOUNTER — Encounter: Payer: Medicaid Other | Admitting: Physician Assistant

## 2022-09-27 ENCOUNTER — Ambulatory Visit (INDEPENDENT_AMBULATORY_CARE_PROVIDER_SITE_OTHER): Payer: Medicaid Other | Admitting: Physician Assistant

## 2022-09-27 ENCOUNTER — Encounter: Payer: Self-pay | Admitting: Physician Assistant

## 2022-09-27 VITALS — BP 138/72 | HR 74 | Temp 98.2°F | Resp 16 | Ht 67.0 in | Wt 222.8 lb

## 2022-09-27 DIAGNOSIS — E669 Obesity, unspecified: Secondary | ICD-10-CM

## 2022-09-27 DIAGNOSIS — Z1212 Encounter for screening for malignant neoplasm of rectum: Secondary | ICD-10-CM

## 2022-09-27 DIAGNOSIS — Z0001 Encounter for general adult medical examination with abnormal findings: Secondary | ICD-10-CM

## 2022-09-27 DIAGNOSIS — I10 Essential (primary) hypertension: Secondary | ICD-10-CM

## 2022-09-27 DIAGNOSIS — E1165 Type 2 diabetes mellitus with hyperglycemia: Secondary | ICD-10-CM | POA: Diagnosis not present

## 2022-09-27 DIAGNOSIS — Z1211 Encounter for screening for malignant neoplasm of colon: Secondary | ICD-10-CM

## 2022-09-27 DIAGNOSIS — Z794 Long term (current) use of insulin: Secondary | ICD-10-CM

## 2022-09-27 DIAGNOSIS — E782 Mixed hyperlipidemia: Secondary | ICD-10-CM | POA: Diagnosis not present

## 2022-09-27 DIAGNOSIS — R300 Dysuria: Secondary | ICD-10-CM

## 2022-09-27 LAB — POCT GLYCOSYLATED HEMOGLOBIN (HGB A1C): Hemoglobin A1C: 6.7 % — AB (ref 4.0–5.6)

## 2022-09-27 MED ORDER — LISINOPRIL 40 MG PO TABS: ORAL_TABLET | ORAL | 1 refills | Status: DC

## 2022-09-27 MED ORDER — ROSUVASTATIN CALCIUM 5 MG PO TABS: 5.0000 mg | ORAL_TABLET | Freq: Every day | ORAL | 1 refills | Status: DC

## 2022-09-27 NOTE — Progress Notes (Signed)
[facility] [address] [place], [place] [address]  Internal MEDICINE  Office Visit Note  Patient Name: Vicki Simmons  [id number]  [id number]  Date of Service: 10/05/2022  Chief Complaint  Patient presents with   Annual Exam   Diabetes   Hypertension     HPI Pt is here for routine health maintenance examination -Down 7 lbs since last visit -Drinking more water -Bp not checked at home recently -BP initially very elevated but improved on recheck,168/72 then 138/72. Will start monitoring regularly -No CP, headaches or blurred vision -Sleep has been ok -reprint labs and will have these done  -no Fhx of colon CA, due for screening. Would like to move forward with colonoscopy and will send referral -Foot exam done -eye exam due in March -declines mammogram or breast exam  Current Medication: Outpatient Encounter Medications as of 09/27/2022  Medication Sig   ALPRAZolam (XANAX) 0.5 MG tablet Take 1 tablet (0.5 mg total) by mouth daily as needed (moderate to severe anxiety).   glimepiride (AMARYL) 4 MG tablet Take 1 tablet (4 mg total) by mouth 2 (two) times daily.   hydrochlorothiazide (HYDRODIURIL) 25 MG tablet Take 1 tablet (25 mg total) by mouth daily.   insulin NPH Human (NOVOLIN N) 100 UNIT/ML injection Inject 60units [place] QAM and 70 units [place] QPM   metFORMIN (GLUCOPHAGE-XR) 500 MG 24 hr tablet Take 2 tablets (1,000 mg total) by mouth 2 (two) times daily.   potassium chloride (KLOR-CON) 10 MEQ tablet Take 1 tablet (10 mEq total) by mouth daily.   [DISCONTINUED] lisinopril (ZESTRIL) 40 MG tablet Take 1 tablet by mouth once daily   [DISCONTINUED] rosuvastatin (CRESTOR) 5 MG tablet Take 1 tablet (5 mg total) by mouth daily.   lisinopril (ZESTRIL) 40 MG tablet Take 1 tablet by mouth once daily   rosuvastatin (CRESTOR) 5 MG tablet Take 1 tablet (5 mg total) by mouth daily.   No facility-administered encounter medications on file as of 09/27/2022.    Surgical  History: Past Surgical History:  Procedure Laterality Date   CESAREAN SECTION     TUBAL LIGATION      Medical History: Past Medical History:  Diagnosis Date   Diabetes (HCC)    Hypertension     Family History: Family History  Problem Relation Age of Onset   Diabetes Mother    Hyperlipidemia Mother    Hypertension Mother    Heart attack Father    Stroke Maternal Grandmother       Review of Systems  Constitutional:  Negative for chills, fatigue and unexpected weight change.  HENT:  Negative for congestion, postnasal drip, rhinorrhea, sneezing and sore throat.   Eyes:  Negative for redness.  Respiratory:  Negative for cough, chest tightness and shortness of breath.   Cardiovascular:  Negative for chest pain and palpitations.  Gastrointestinal:  Negative for abdominal pain, constipation, diarrhea, nausea and vomiting.  Genitourinary:  Negative for dysuria and frequency.  Musculoskeletal:  Negative for arthralgias, back pain, joint swelling and neck pain.  Skin:  Negative for rash.  Neurological: Negative.  Negative for tremors and numbness.  Hematological:  Negative for adenopathy. Does not bruise/bleed easily.  Psychiatric/Behavioral:  Negative for behavioral problems (Depression), sleep disturbance and suicidal ideas. The patient is not nervous/anxious.      Vital Signs: BP 138/72 Comment: 182/166  Pulse 74   Temp 98.2 F (36.8 C)   Resp 16   Ht 5\' 7"  (1.702 m)   Wt 222 lb 12.8  oz (101.1 kg)   SpO2 98%   BMI 34.90 kg/m    Physical Exam Vitals and nursing note reviewed.  Constitutional:      General: She is not in acute distress.    Appearance: She is well-developed. She is obese. She is not diaphoretic.  HENT:     Head: Normocephalic and atraumatic.     Mouth/Throat:     Pharynx: No oropharyngeal exudate.  Eyes:     Pupils: Pupils are equal, round, and reactive to light.  Neck:     Thyroid: No thyromegaly.     Vascular: No JVD.     Trachea: No  tracheal deviation.  Cardiovascular:     Rate and Rhythm: Normal rate and regular rhythm.     Pulses:          Dorsalis pedis pulses are 3+ on the right side and 3+ on the left side.       Posterior tibial pulses are 3+ on the right side and 3+ on the left side.     Heart sounds: Normal heart sounds. No murmur heard.    No friction rub. No gallop.  Pulmonary:     Effort: Pulmonary effort is normal. No respiratory distress.     Breath sounds: No wheezing or rales.  Chest:     Chest wall: No tenderness.  Abdominal:     General: Bowel sounds are normal.     Palpations: Abdomen is soft.     Tenderness: There is no abdominal tenderness.  Musculoskeletal:        General: Normal range of motion.     Cervical back: Normal range of motion and neck supple.     Right foot: Normal range of motion.     Left foot: Normal range of motion.  Feet:     Right foot:     Protective Sensation: 2 sites tested.  2 sites sensed.     Skin integrity: Skin integrity normal.     Toenail Condition: Right toenails are normal.     Left foot:     Protective Sensation: 2 sites tested.  2 sites sensed.     Skin integrity: Skin integrity normal.     Toenail Condition: Left toenails are normal.  Lymphadenopathy:     Cervical: No cervical adenopathy.  Skin:    General: Skin is warm and dry.  Neurological:     Mental Status: She is alert and oriented to person, place, and time.     Cranial Nerves: No cranial nerve deficit.  Psychiatric:        Behavior: Behavior normal.        Thought Content: Thought content normal.        Judgment: Judgment normal.      LABS: Recent Results (from the past 2160 hour(s))  POCT HgB A1C     Status: Abnormal   Collection Time: 09/27/22 10:25 AM  Result Value Ref Range   Hemoglobin A1C 6.7 (A) 4.0 - 5.6 %   HbA1c POC (<> result, manual entry)     HbA1c, POC (prediabetic range)     HbA1c, POC (controlled diabetic range)    UA/M w/rflx Culture, Routine     Status: Abnormal    Collection Time: 09/27/22  4:02 PM   Specimen: Urine   Urine  Result Value Ref Range   Specific Gravity, UA 1.019 1.005 - 1.030   pH, UA 5.5 5.0 - 7.5   Color, UA Yellow Yellow   Appearance Ur Clear  Clear   Leukocytes,UA Negative Negative   Protein,UA Negative Negative/Trace   Glucose, UA Negative Negative   Ketones, UA Trace (A) Negative   RBC, UA Negative Negative   Bilirubin, UA Negative Negative   Urobilinogen, Ur 0.2 0.2 - 1.0 mg/dL   Nitrite, UA Negative Negative   Microscopic Examination Comment     Comment: Microscopic follows if indicated.   Microscopic Examination See below:     Comment: Microscopic was indicated and was performed.   Urinalysis Reflex Comment     Comment: This specimen will not reflex to a Urine Culture.  Urine Microalbumin w/creat. ratio     Status: None   Collection Time: 09/27/22  4:02 PM  Result Value Ref Range   Creatinine, Urine 88.8 Not Estab. mg/dL   Microalbumin, Urine <3.0 Not Estab. ug/mL   Microalb/Creat Ratio <3 0 - 29 mg/g creat    Comment:                        Normal:                0 -  29                        Moderately increased: 30 - 300                        Severely increased:       >300   Microscopic Examination     Status: None   Collection Time: 09/27/22  4:02 PM   Urine  Result Value Ref Range   WBC, UA None seen 0 - 5 /hpf   RBC, Urine 0-2 0 - 2 /hpf   Epithelial Cells (non renal) 0-10 0 - 10 /hpf   Casts None seen None seen /lpf   Bacteria, UA None seen None seen/Few        Assessment/Plan: 1. Encounter for general adult medical examination with abnormal findings Cpe performed, routine labs already ordered and will be done, declines mammogram, due for colonoscopy  2. Essential hypertension, benign Improved on recheck, will continue current medications and monitor closely at home. Call if elevated. - lisinopril (ZESTRIL) 40 MG tablet; Take 1 tablet by mouth once daily  Dispense: 90 tablet; Refill:  1  3. Type 2 diabetes mellitus with hyperglycemia, with long-term current use of insulin (HCC) - POCT HgB A1C is 6.7 which is up slightly from 6.5 lats visit. Continue current medications and working on diet and exercise. - Urine Microalbumin w/creat. ratio  4. Mixed hyperlipidemia Continue crestor - rosuvastatin (CRESTOR) 5 MG tablet; Take 1 tablet (5 mg total) by mouth daily.  Dispense: 90 tablet; Refill: 1  5. Screening for colorectal cancer - Ambulatory referral to Gastroenterology  6. Dysuria - UA/M w/rflx Culture, Routine  7. Obesity (BMI 30-39.9) Down 7 lbs since last visit and encouraged to continue working on diet and exercise   General Counseling: anyiah ollis understanding of the findings of todays visit and agrees with plan of treatment. I have discussed any further diagnostic evaluation that may be needed or ordered today. We also reviewed her medications today. she has been encouraged to call the office with any questions or concerns that should arise related to todays visit.    Counseling:    Orders Placed This Encounter  Procedures   Microscopic Examination   UA/M w/rflx Culture, Routine   Urine Microalbumin w/creat. ratio  Ambulatory referral to Gastroenterology   POCT HgB A1C    Meds ordered this encounter  Medications   lisinopril (ZESTRIL) 40 MG tablet    Sig: Take 1 tablet by mouth once daily    Dispense:  90 tablet    Refill:  1   rosuvastatin (CRESTOR) 5 MG tablet    Sig: Take 1 tablet (5 mg total) by mouth daily.    Dispense:  90 tablet    Refill:  1    This patient was seen by Drema Dallas, PA-C in collaboration with Dr. Clayborn Bigness as a part of collaborative care agreement.  Total time spent:35 Minutes  Time spent includes review of chart, medications, test results, and follow up plan with the patient.     Lavera Guise, MD  Internal Medicine

## 2022-09-28 LAB — UA/M W/RFLX CULTURE, ROUTINE
Bilirubin, UA: NEGATIVE
Glucose, UA: NEGATIVE
Leukocytes,UA: NEGATIVE
Nitrite, UA: NEGATIVE
Protein,UA: NEGATIVE
RBC, UA: NEGATIVE
Specific Gravity, UA: 1.019 (ref 1.005–1.030)
Urobilinogen, Ur: 0.2 mg/dL (ref 0.2–1.0)
pH, UA: 5.5 (ref 5.0–7.5)

## 2022-09-28 LAB — MICROSCOPIC EXAMINATION
Bacteria, UA: NONE SEEN
Casts: NONE SEEN /lpf
WBC, UA: NONE SEEN /hpf (ref 0–5)

## 2022-09-28 LAB — MICROALBUMIN / CREATININE URINE RATIO
Creatinine, Urine: 88.8 mg/dL
Microalb/Creat Ratio: <3 mg/g creat (ref 0–29)
Microalbumin, Urine: <3 ug/mL

## 2022-10-07 LAB — COMPREHENSIVE METABOLIC PANEL
ALT: 16 IU/L (ref 0–32)
AST: 16 IU/L (ref 0–40)
Albumin/Globulin Ratio: 2.3 — ABNORMAL HIGH (ref 1.2–2.2)
Albumin: 4.6 g/dL (ref 3.9–4.9)
Alkaline Phosphatase: 52 IU/L (ref 44–121)
BUN/Creatinine Ratio: 16 (ref 9–23)
BUN: 10 mg/dL (ref 6–24)
Bilirubin Total: <0.2 mg/dL (ref 0.0–1.2)
CO2: 20 mmol/L (ref 20–29)
Calcium: 9.6 mg/dL (ref 8.7–10.2)
Chloride: 99 mmol/L (ref 96–106)
Creatinine, Ser: 0.64 mg/dL (ref 0.57–1.00)
Globulin, Total: 2 g/dL (ref 1.5–4.5)
Glucose: 73 mg/dL (ref 70–99)
Potassium: 4.1 mmol/L (ref 3.5–5.2)
Sodium: 139 mmol/L (ref 134–144)
Total Protein: 6.6 g/dL (ref 6.0–8.5)
eGFR: 111 mL/min/{1.73_m2} (ref >59)

## 2022-10-07 LAB — TSH+FREE T4
Free T4: 1.02 ng/dL (ref 0.82–1.77)
TSH: 1.46 u[IU]/mL (ref 0.450–4.500)

## 2022-10-07 LAB — CBC WITH DIFFERENTIAL/PLATELET
Basophils Absolute: 0.1 10*3/uL (ref 0.0–0.2)
Basos: 1 %
EOS (ABSOLUTE): 0.2 10*3/uL (ref 0.0–0.4)
Eos: 2 %
Hematocrit: 38.6 % (ref 34.0–46.6)
Hemoglobin: 12.9 g/dL (ref 11.1–15.9)
Immature Grans (Abs): 0 10*3/uL (ref 0.0–0.1)
Immature Granulocytes: 0 %
Lymphocytes Absolute: 3.1 10*3/uL (ref 0.7–3.1)
Lymphs: 33 %
MCH: 30 pg (ref 26.6–33.0)
MCHC: 33.4 g/dL (ref 31.5–35.7)
MCV: 90 fL (ref 79–97)
Monocytes Absolute: 0.7 10*3/uL (ref 0.1–0.9)
Monocytes: 8 %
Neutrophils Absolute: 5.3 10*3/uL (ref 1.4–7.0)
Neutrophils: 56 %
Platelets: 378 10*3/uL (ref 150–450)
RBC: 4.3 x10E6/uL (ref 3.77–5.28)
RDW: 12.3 % (ref 11.7–15.4)
WBC: 9.4 10*3/uL (ref 3.4–10.8)

## 2022-10-07 LAB — LIPID PANEL WITH LDL/HDL RATIO
Cholesterol, Total: 148 mg/dL (ref 100–199)
HDL: 45 mg/dL (ref >39)
LDL Chol Calc (NIH): 74 mg/dL (ref 0–99)
LDL/HDL Ratio: 1.6 ratio (ref 0.0–3.2)
Triglycerides: 172 mg/dL — ABNORMAL HIGH (ref 0–149)
VLDL Cholesterol Cal: 29 mg/dL (ref 5–40)

## 2022-10-21 ENCOUNTER — Telehealth: Payer: Self-pay

## 2022-10-21 NOTE — Telephone Encounter (Signed)
Tried to talk to patient regarding normal lab results but mailbox was full and could not leave a message.

## 2022-10-21 NOTE — Telephone Encounter (Signed)
Send mychart message

## 2022-10-21 NOTE — Telephone Encounter (Signed)
-----   Message from Lauren K McDonough, PA-C sent at 10/20/2022  4:50 PM EST ----- Please let her know that her labs look good, her cholesterol is significantly improved! 

## 2022-10-21 NOTE — Telephone Encounter (Signed)
-----   Message from Carlean Jews, PA-C sent at 10/20/2022  4:50 PM EST ----- Please let her know that her labs look good, her cholesterol is significantly improved!

## 2022-12-03 ENCOUNTER — Ambulatory Visit: Payer: Medicaid Other | Admitting: Podiatry

## 2022-12-30 ENCOUNTER — Ambulatory Visit: Payer: BLUE CROSS/BLUE SHIELD | Admitting: Physician Assistant

## 2023-01-06 ENCOUNTER — Encounter: Payer: Self-pay | Admitting: Physician Assistant

## 2023-01-06 ENCOUNTER — Ambulatory Visit (INDEPENDENT_AMBULATORY_CARE_PROVIDER_SITE_OTHER): Payer: BLUE CROSS/BLUE SHIELD | Admitting: Physician Assistant

## 2023-01-06 VITALS — BP 136/78 | HR 83 | Temp 98.3°F | Resp 16 | Ht 67.0 in | Wt 224.8 lb

## 2023-01-06 DIAGNOSIS — E782 Mixed hyperlipidemia: Secondary | ICD-10-CM | POA: Diagnosis not present

## 2023-01-06 DIAGNOSIS — Z794 Long term (current) use of insulin: Secondary | ICD-10-CM

## 2023-01-06 DIAGNOSIS — E669 Obesity, unspecified: Secondary | ICD-10-CM

## 2023-01-06 DIAGNOSIS — E1165 Type 2 diabetes mellitus with hyperglycemia: Secondary | ICD-10-CM | POA: Diagnosis not present

## 2023-01-06 DIAGNOSIS — I10 Essential (primary) hypertension: Secondary | ICD-10-CM | POA: Diagnosis not present

## 2023-01-06 LAB — POCT GLYCOSYLATED HEMOGLOBIN (HGB A1C): Hemoglobin A1C: 6.6 % — AB (ref 4.0–5.6)

## 2023-01-06 MED ORDER — METFORMIN HCL ER 500 MG PO TB24: 1000.0000 mg | ORAL_TABLET | Freq: Two times a day (BID) | ORAL | 1 refills | Status: DC

## 2023-01-06 MED ORDER — LISINOPRIL 40 MG PO TABS: ORAL_TABLET | ORAL | 1 refills | Status: DC

## 2023-01-06 MED ORDER — ROSUVASTATIN CALCIUM 5 MG PO TABS: 5.0000 mg | ORAL_TABLET | Freq: Every day | ORAL | 1 refills | Status: DC

## 2023-01-06 MED ORDER — GLIMEPIRIDE 4 MG PO TABS: 4.0000 mg | ORAL_TABLET | Freq: Two times a day (BID) | ORAL | 1 refills | Status: DC

## 2023-01-06 NOTE — Progress Notes (Signed)
[facility] [place], [place] [address]  Internal MEDICINE  Office Visit Note  Patient Name: Vicki Simmons  [id number]  [id number]  Date of Service: 01/06/2023  Chief Complaint  Patient presents with   Follow-up   Diabetes   Hypertension    HPI Pt is here for routine follow up -Bg have been good, 95-145 in Am -Cholesterol significantly improved and other labs looked good -BP stable -Applauded for how well she is doing and to keep this up  Current Medication: Outpatient Encounter Medications as of 01/06/2023  Medication Sig   ALPRAZolam (XANAX) 0.5 MG tablet Take 1 tablet (0.5 mg total) by mouth daily as needed (moderate to severe anxiety).   hydrochlorothiazide (HYDRODIURIL) 25 MG tablet Take 1 tablet (25 mg total) by mouth daily.   insulin NPH Human (NOVOLIN N) 100 UNIT/ML injection Inject 60units [place] QAM and 70 units [place] QPM   potassium chloride (KLOR-CON) 10 MEQ tablet Take 1 tablet (10 mEq total) by mouth daily.   [DISCONTINUED] glimepiride (AMARYL) 4 MG tablet Take 1 tablet (4 mg total) by mouth 2 (two) times daily.   [DISCONTINUED] lisinopril (ZESTRIL) 40 MG tablet Take 1 tablet by mouth once daily   [DISCONTINUED] metFORMIN (GLUCOPHAGE-XR) 500 MG 24 hr tablet Take 2 tablets (1,000 mg total) by mouth 2 (two) times daily.   [DISCONTINUED] rosuvastatin (CRESTOR) 5 MG tablet Take 1 tablet (5 mg total) by mouth daily.   glimepiride (AMARYL) 4 MG tablet Take 1 tablet (4 mg total) by mouth 2 (two) times daily.   lisinopril (ZESTRIL) 40 MG tablet Take 1 tablet by mouth once daily   metFORMIN (GLUCOPHAGE-XR) 500 MG 24 hr tablet Take 2 tablets (1,000 mg total) by mouth 2 (two) times daily.   rosuvastatin (CRESTOR) 5 MG tablet Take 1 tablet (5 mg total) by mouth daily.   No facility-administered encounter medications on file as of 01/06/2023.    Surgical History: Past Surgical History:  Procedure Laterality Date   CESAREAN SECTION     TUBAL LIGATION       Medical History: Past Medical History:  Diagnosis Date   Diabetes ([place])    Hypertension     Family History: Family History  Problem Relation Age of Onset   Diabetes Mother    Hyperlipidemia Mother    Hypertension Mother    Heart attack Father    Stroke Maternal Grandmother     Social History   Socioeconomic History   Marital status: Married    Spouse name: Not on file   Number of children: Not on file   Years of education: Not on file   Highest education level: Not on file  Occupational History   Not on file  Tobacco Use   Smoking status: Former    Packs/day: 1    Types: Cigarettes    Quit date: 09/11/2021    Years since quitting: 1.3   Smokeless tobacco: Never   Tobacco comments:    Quit in Nov.   Vaping Use   Vaping Use: Every day  Substance and Sexual Activity   Alcohol use: No   Drug use: No   Sexual activity: Not on file  Other Topics Concern   Not on file  Social History Narrative   Not on file   Social Determinants of Health   Financial Resource Strain: Not on file  Food Insecurity: Not on file  Transportation Needs: Not on file  Physical Activity: Not on file  Stress: Not on  file  Social Connections: Not on file  Intimate Partner Violence: Not on file      Review of Systems  Constitutional:  Negative for chills, fatigue and unexpected weight change.  HENT:  Negative for congestion, postnasal drip, rhinorrhea, sneezing and sore throat.   Eyes:  Negative for redness.  Respiratory:  Negative for cough, chest tightness and shortness of breath.   Cardiovascular:  Negative for chest pain and palpitations.  Gastrointestinal:  Negative for abdominal pain, constipation, diarrhea, nausea and vomiting.  Genitourinary:  Negative for dysuria and frequency.  Musculoskeletal:  Negative for arthralgias, back pain, joint swelling and neck pain.  Skin:  Negative for rash.  Neurological: Negative.  Negative for tremors and numbness.  Hematological:   Negative for adenopathy. Does not bruise/bleed easily.  Psychiatric/Behavioral:  Negative for behavioral problems (Depression), sleep disturbance and suicidal ideas. The patient is not nervous/anxious.     Vital Signs: BP 136/78   Pulse 83   Temp 98.3 F (36.8 C)   Resp 16   Ht '5\' 7"'$  (1.702 m)   Wt 224 lb 12.8 oz (102 kg)   SpO2 96%   BMI 35.21 kg/m    Physical Exam Vitals reviewed.  Constitutional:      General: She is not in acute distress.    Appearance: Normal appearance. She is obese. She is not ill-appearing.  HENT:     Head: Normocephalic and atraumatic.  Eyes:     Pupils: Pupils are equal, round, and reactive to light.  Cardiovascular:     Rate and Rhythm: Normal rate and regular rhythm.  Pulmonary:     Effort: Pulmonary effort is normal. No respiratory distress.  Musculoskeletal:        General: Normal range of motion.  Skin:    General: Skin is warm and dry.  Neurological:     Mental Status: She is alert and oriented to person, place, and time.  Psychiatric:        Mood and Affect: Mood normal.        Behavior: Behavior normal.        Assessment/Plan: 1. Type 2 diabetes mellitus with hyperglycemia, with long-term current use of insulin (HCC) - POCT HgB A1C is 6.6 which is improved from 6.7 last visit. Continue current medications and working on diet and exercise. - metFORMIN (GLUCOPHAGE-XR) 500 MG 24 hr tablet; Take 2 tablets (1,000 mg total) by mouth 2 (two) times daily.  Dispense: 360 tablet; Refill: 1 - glimepiride (AMARYL) 4 MG tablet; Take 1 tablet (4 mg total) by mouth 2 (two) times daily.  Dispense: 180 tablet; Refill: 1  2. Essential hypertension, benign Stable, continue current medications - lisinopril (ZESTRIL) 40 MG tablet; Take 1 tablet by mouth once daily  Dispense: 90 tablet; Refill: 1  3. Mixed hyperlipidemia Improving, continue crestor - rosuvastatin (CRESTOR) 5 MG tablet; Take 1 tablet (5 mg total) by mouth daily.  Dispense: 90  tablet; Refill: 1  4. Obesity (BMI 30-39.9) Continue to work on diet and exercise   General Counseling: yaeli herman understanding of the findings of todays visit and agrees with plan of treatment. I have discussed any further diagnostic evaluation that may be needed or ordered today. We also reviewed her medications today. she has been encouraged to call the office with any questions or concerns that should arise related to todays visit.    Orders Placed This Encounter  Procedures   POCT HgB A1C    Meds ordered this encounter  Medications  metFORMIN (GLUCOPHAGE-XR) 500 MG 24 hr tablet    Sig: Take 2 tablets (1,000 mg total) by mouth 2 (two) times daily.    Dispense:  360 tablet    Refill:  1   lisinopril (ZESTRIL) 40 MG tablet    Sig: Take 1 tablet by mouth once daily    Dispense:  90 tablet    Refill:  1   glimepiride (AMARYL) 4 MG tablet    Sig: Take 1 tablet (4 mg total) by mouth 2 (two) times daily.    Dispense:  180 tablet    Refill:  1   rosuvastatin (CRESTOR) 5 MG tablet    Sig: Take 1 tablet (5 mg total) by mouth daily.    Dispense:  90 tablet    Refill:  1    This patient was seen by Drema Dallas, PA-C in collaboration with Dr. Clayborn Bigness as a part of collaborative care agreement.   Total time spent:30 Minutes Time spent includes review of chart, medications, test results, and follow up plan with the patient.      Dr Lavera Guise Internal medicine

## 2023-05-12 ENCOUNTER — Ambulatory Visit (INDEPENDENT_AMBULATORY_CARE_PROVIDER_SITE_OTHER): Payer: BLUE CROSS/BLUE SHIELD | Admitting: Physician Assistant

## 2023-05-12 ENCOUNTER — Encounter: Payer: Self-pay | Admitting: Physician Assistant

## 2023-05-12 VITALS — BP 130/83 | HR 75 | Temp 98.4°F | Resp 16 | Ht 67.0 in | Wt 223.4 lb

## 2023-05-12 DIAGNOSIS — E1165 Type 2 diabetes mellitus with hyperglycemia: Secondary | ICD-10-CM

## 2023-05-12 DIAGNOSIS — E782 Mixed hyperlipidemia: Secondary | ICD-10-CM

## 2023-05-12 DIAGNOSIS — R5383 Other fatigue: Secondary | ICD-10-CM

## 2023-05-12 DIAGNOSIS — R946 Abnormal results of thyroid function studies: Secondary | ICD-10-CM | POA: Diagnosis not present

## 2023-05-12 DIAGNOSIS — Z794 Long term (current) use of insulin: Secondary | ICD-10-CM

## 2023-05-12 DIAGNOSIS — I10 Essential (primary) hypertension: Secondary | ICD-10-CM | POA: Diagnosis not present

## 2023-05-12 LAB — POCT GLYCOSYLATED HEMOGLOBIN (HGB A1C): Hemoglobin A1C: 6.3 % — AB (ref 4.0–5.6)

## 2023-05-12 MED ORDER — GLIMEPIRIDE 4 MG PO TABS: 4.0000 mg | ORAL_TABLET | Freq: Two times a day (BID) | ORAL | 1 refills | Status: DC

## 2023-05-12 MED ORDER — METFORMIN HCL ER 500 MG PO TB24: 1000.0000 mg | ORAL_TABLET | Freq: Two times a day (BID) | ORAL | 1 refills | Status: DC

## 2023-05-12 NOTE — Progress Notes (Signed)
[facility] [address] [place], [place] [address]  Internal MEDICINE  Office Visit Note  Patient Name: Vicki Simmons  [id number]  [id number]  Date of Service: 05/12/2023  Chief Complaint  Patient presents with   Follow-up   Diabetes   Hypertension   Quality Metric Gaps    Colonoscopy    HPI Pt is here for routine follow up -BP stable -BG well controlled at home, 100-120 in AM -Referred to GI for colonoscopy but has not gotten this scheduled -A1c much improved, may drop to 65 units at night and drop further if numbers low -CPE next visit and will order labs to have done 1 week prior including next A1c since dropping insulin  Current Medication: Outpatient Encounter Medications as of 05/12/2023  Medication Sig   ALPRAZolam (XANAX) 0.5 MG tablet Take 1 tablet (0.5 mg total) by mouth daily as needed (moderate to severe anxiety).   hydrochlorothiazide (HYDRODIURIL) 25 MG tablet Take 1 tablet (25 mg total) by mouth daily.   insulin NPH Human (NOVOLIN N) 100 UNIT/ML injection Inject 60units [place] QAM and 70 units [place] QPM   lisinopril (ZESTRIL) 40 MG tablet Take 1 tablet by mouth once daily   potassium chloride (KLOR-CON) 10 MEQ tablet Take 1 tablet (10 mEq total) by mouth daily.   rosuvastatin (CRESTOR) 5 MG tablet Take 1 tablet (5 mg total) by mouth daily.   [DISCONTINUED] glimepiride (AMARYL) 4 MG tablet Take 1 tablet (4 mg total) by mouth 2 (two) times daily.   [DISCONTINUED] metFORMIN (GLUCOPHAGE-XR) 500 MG 24 hr tablet Take 2 tablets (1,000 mg total) by mouth 2 (two) times daily.   glimepiride (AMARYL) 4 MG tablet Take 1 tablet (4 mg total) by mouth 2 (two) times daily.   metFORMIN (GLUCOPHAGE-XR) 500 MG 24 hr tablet Take 2 tablets (1,000 mg total) by mouth 2 (two) times daily.   No facility-administered encounter medications on file as of 05/12/2023.    Surgical History: Past Surgical History:  Procedure Laterality Date   CESAREAN SECTION     TUBAL LIGATION       Medical History: Past Medical History:  Diagnosis Date   Diabetes (HCC)    Hypertension     Family History: Family History  Problem Relation Age of Onset   Diabetes Mother    Hyperlipidemia Mother    Hypertension Mother    Heart attack Father    Stroke Maternal Grandmother     Social History   Socioeconomic History   Marital status: Married    Spouse name: Not on file   Number of children: Not on file   Years of education: Not on file   Highest education level: Not on file  Occupational History   Not on file  Tobacco Use   Smoking status: Former    Current packs/day: 0.00    Types: Cigarettes    Quit date: 09/11/2021    Years since quitting: 1.6   Smokeless tobacco: Never   Tobacco comments:    Quit in Nov.   Vaping Use   Vaping status: Every Day  Substance and Sexual Activity   Alcohol use: No   Drug use: No   Sexual activity: Not on file  Other Topics Concern   Not on file  Social History Narrative   Not on file   Social Determinants of Health   Financial Resource Strain: Not on file  Food Insecurity: Not on file  Transportation Needs: Not on file  Physical Activity: Not on  file  Stress: Not on file  Social Connections: Not on file  Intimate Partner Violence: Not on file      Review of Systems  Constitutional:  Negative for chills, fatigue and unexpected weight change.  HENT:  Negative for congestion, postnasal drip, rhinorrhea, sneezing and sore throat.   Eyes:  Negative for redness.  Respiratory:  Negative for cough, chest tightness and shortness of breath.   Cardiovascular:  Negative for chest pain and palpitations.  Gastrointestinal:  Negative for abdominal pain, constipation, diarrhea, nausea and vomiting.  Genitourinary:  Negative for dysuria and frequency.  Musculoskeletal:  Negative for arthralgias, back pain, joint swelling and neck pain.  Skin:  Negative for rash.  Neurological: Negative.  Negative for tremors and numbness.   Hematological:  Negative for adenopathy. Does not bruise/bleed easily.  Psychiatric/Behavioral:  Negative for behavioral problems (Depression), sleep disturbance and suicidal ideas. The patient is not nervous/anxious.     Vital Signs: BP 130/83   Pulse 75   Temp 98.4 F (36.9 C)   Resp 16   Ht 5\' 7"  (1.702 m)   Wt 223 lb 6.4 oz (101.3 kg)   SpO2 98%   BMI 34.99 kg/m    Physical Exam Vitals reviewed.  Constitutional:      General: She is not in acute distress.    Appearance: Normal appearance. She is obese. She is not ill-appearing.  HENT:     Head: Normocephalic and atraumatic.  Eyes:     Pupils: Pupils are equal, round, and reactive to light.  Cardiovascular:     Rate and Rhythm: Normal rate and regular rhythm.  Pulmonary:     Effort: Pulmonary effort is normal. No respiratory distress.  Musculoskeletal:        General: Normal range of motion.  Skin:    General: Skin is warm and dry.  Neurological:     Mental Status: She is alert and oriented to person, place, and time.  Psychiatric:        Mood and Affect: Mood normal.        Behavior: Behavior normal.        Assessment/Plan: 1. Type 2 diabetes mellitus with hyperglycemia, with long-term current use of insulin (HCC) - POCT HgB A1C is 6.3 which is improved from 6.6 and is very well controlled. May drop evening insulin dose from 70 to 65units and may drop further if lows. - glimepiride (AMARYL) 4 MG tablet; Take 1 tablet (4 mg total) by mouth 2 (two) times daily.  Dispense: 180 tablet; Refill: 1 - metFORMIN (GLUCOPHAGE-XR) 500 MG 24 hr tablet; Take 2 tablets (1,000 mg total) by mouth 2 (two) times daily.  Dispense: 360 tablet; Refill: 1 - Hgb A1C w/o eAG  2. Essential hypertension, benign Stable, continue current medication  3. Mixed hyperlipidemia - Lipid Panel With LDL/HDL Ratio  4. Abnormal thyroid exam - TSH + free T4  5. Other fatigue - CBC w/Diff/Platelet - Comprehensive metabolic panel - TSH +  free T4 - Lipid Panel With LDL/HDL Ratio   General Counseling: Paisely verbalizes understanding of the findings of todays visit and agrees with plan of treatment. I have discussed any further diagnostic evaluation that may be needed or ordered today. We also reviewed her medications today. she has been encouraged to call the office with any questions or concerns that should arise related to todays visit.    Orders Placed This Encounter  Procedures   CBC w/Diff/Platelet   Comprehensive metabolic panel   TSH +  free T4   Lipid Panel With LDL/HDL Ratio   Hgb A1C w/o eAG   POCT HgB A1C    Meds ordered this encounter  Medications   glimepiride (AMARYL) 4 MG tablet    Sig: Take 1 tablet (4 mg total) by mouth 2 (two) times daily.    Dispense:  180 tablet    Refill:  1   metFORMIN (GLUCOPHAGE-XR) 500 MG 24 hr tablet    Sig: Take 2 tablets (1,000 mg total) by mouth 2 (two) times daily.    Dispense:  360 tablet    Refill:  1    This patient was seen by Lynn Ito, PA-C in collaboration with Dr. Beverely Risen as a part of collaborative care agreement.   Total time spent:30 Minutes Time spent includes review of chart, medications, test results, and follow up plan with the patient.      Dr Lyndon Code Internal medicine

## 2023-08-10 ENCOUNTER — Other Ambulatory Visit: Payer: Self-pay

## 2023-08-10 ENCOUNTER — Emergency Department
Admission: EM | Admit: 2023-08-10 | Discharge: 2023-08-11 | Disposition: A | Discharge status: Home / Self Care | Payer: BLUE CROSS/BLUE SHIELD | Attending: Emergency Medicine | Admitting: Emergency Medicine

## 2023-08-10 ENCOUNTER — Emergency Department: Payer: BLUE CROSS/BLUE SHIELD

## 2023-08-10 DIAGNOSIS — R569 Unspecified convulsions: Secondary | ICD-10-CM | POA: Insufficient documentation

## 2023-08-10 LAB — CBC
HCT: 38 % (ref 36.0–46.0)
Hemoglobin: 12.9 g/dL (ref 12.0–15.0)
MCH: 30.1 pg (ref 26.0–34.0)
MCHC: 33.9 g/dL (ref 30.0–36.0)
MCV: 88.6 fL (ref 80.0–100.0)
Platelets: 411 10*3/uL — ABNORMAL HIGH (ref 150–400)
RBC: 4.29 MIL/uL (ref 3.87–5.11)
RDW: 12.6 % (ref 11.5–15.5)
WBC: 15.4 10*3/uL — ABNORMAL HIGH (ref 4.0–10.5)
nRBC: 0 % (ref 0.0–0.2)

## 2023-08-10 LAB — BASIC METABOLIC PANEL
Anion gap: 14 (ref 5–15)
BUN: 11 mg/dL (ref 6–20)
CO2: 25 mmol/L (ref 22–32)
Calcium: 9.4 mg/dL (ref 8.9–10.3)
Chloride: 98 mmol/L (ref 98–111)
Creatinine, Ser: 0.69 mg/dL (ref 0.44–1.00)
GFR, Estimated: >60 mL/min (ref >60)
Glucose, Bld: 158 mg/dL — ABNORMAL HIGH (ref 70–99)
Potassium: 3.2 mmol/L — ABNORMAL LOW (ref 3.5–5.1)
Sodium: 137 mmol/L (ref 135–145)

## 2023-08-10 LAB — MAGNESIUM: Magnesium: 1.9 mg/dL (ref 1.7–2.4)

## 2023-08-10 LAB — CBG MONITORING, ED: Glucose-Capillary: 147 mg/dL — ABNORMAL HIGH (ref 70–99)

## 2023-08-10 LAB — TROPONIN I (HIGH SENSITIVITY): Troponin I (High Sensitivity): 4 ng/L (ref <18)

## 2023-08-10 NOTE — ED Provider Notes (Signed)
[facility] Provider Note    Event Date/Time   First MD Initiated Contact with Patient 08/10/23 2123     (approximate)   History   Loss of Consciousness   HPI  Vicki Simmons is a 47 y.o. female  who presents to the emergency department today after an apparent seizure.  The patient states that she had stayed up all night last night because her mother was in the hospital.  She then remembers waking up on the floor.  She does not think that she significantly injured any part of her body from this apparent seizure.  Patient had a similar episode a number of years ago.  The patient did not follow-up with neurology.  She denies any alcohol use.  Denies any headache today.     Physical Exam   Triage Vital Signs: ED Triage Vitals  Encounter Vitals Group     BP 08/10/23 2041 132/78     Systolic BP Percentile --      Diastolic BP Percentile --      Pulse Rate 08/10/23 2041 87     Resp 08/10/23 2041 18     Temp 08/10/23 2041 98 F (36.7 C)     Temp Source 08/10/23 2041 Oral     SpO2 08/10/23 2035 94 %     Weight 08/10/23 2042 220 lb (99.8 kg)     Height 08/10/23 2042 5\' 7"  (1.702 m)     Head Circumference --      Peak Flow --      Pain Score 08/10/23 2041 0     Pain Loc --      Pain Education --      Exclude from Growth Chart --     Most recent vital signs: Vitals:   08/10/23 2035 08/10/23 2041  BP:  132/78  Pulse:  87  Resp:  18  Temp:  98 F (36.7 C)  SpO2: 94% 94%   General: Awake, alert, oriented. CV:  Good peripheral perfusion. Regular rate and rhythm.  Resp:  Normal effort. Lungs clear. Abd:  No distention.    ED Results / Procedures / Treatments   Labs (all labs ordered are listed, but only abnormal results are displayed) Labs Reviewed  BASIC METABOLIC PANEL - Abnormal; Notable for the following components:      Result Value   Potassium 3.2 (*)    Glucose, Bld 158 (*)    All other components within normal limits  CBC -  Abnormal; Notable for the following components:   WBC 15.4 (*)    Platelets 411 (*)    All other components within normal limits  CBG MONITORING, ED - Abnormal; Notable for the following components:   Glucose-Capillary 147 (*)    All other components within normal limits  URINALYSIS, ROUTINE W REFLEX MICROSCOPIC  MAGNESIUM  POC URINE PREG, ED  TROPONIN I (HIGH SENSITIVITY)     EKG  I, Phineas Semen, attending physician, personally viewed and interpreted this EKG  EKG Time: 2055 Rate: 88 Rhythm: normal sinus rhythm Axis: normal Intervals: qtc 469 QRS: narrow, LVH ST changes: no st elevation Impression: abnormal ekg   RADIOLOGY I independently interpreted and visualized the CT head. My interpretation: no bleed. No mass Radiology interpretation: pending   PROCEDURES:  Critical Care performed: No   MEDICATIONS ORDERED IN ED: Medications - No data to display   IMPRESSION / MDM / ASSESSMENT AND PLAN / ED COURSE  I reviewed the triage vital  signs and the nursing notes.                              Differential diagnosis includes, but is not limited to, seizure, syncope, anemia, electrolyte abnormality, intracranial process.  Patient's presentation is most consistent with acute presentation with potential threat to life or bodily function.   The patient is on the cardiac monitor to evaluate for evidence of arrhythmia and/or significant heart rate changes.  Patient presented to the emergency department today because of concerns for seizure like episode.  At the time my exam patient is awake and alert.  Blood work without concerning electrolyte abnormality.  I do wonder if patient's sleep deprivation triggered this episode.  I did not see any abnormality on the head CT.  Did discuss with patient importance of follow-up with neurology.      FINAL CLINICAL IMPRESSION(S) / ED DIAGNOSES   Final diagnoses:  Seizure [facility])     Note:  This document was prepared using  Dragon voice recognition software and may include unintentional dictation errors.    Phineas Semen, MD 08/10/23 [phone]

## 2023-08-10 NOTE — ED Triage Notes (Signed)
Patient wheeled to triage after being found down by family at home. Patient states she has no recollection of events tonight. She stated she was here all day with her mother and up all night with her, went home tonight to change clothes, eat dinner, and go to bed when she woke up on the floor with EMS around her. EMS administered 4mg  zofran. Patient complaints of being fatigued and weak all over currently but is alert and oriented x4 in triage.

## 2023-08-10 NOTE — Discharge Instructions (Addendum)
Your head CT was negative.  We apologize for the significant delays.  Your lab work, urine were reassuring.  I recommend close follow-up with neurology for further workup for what we suspect are seizures.  They will likely start you on antiepileptic medications.  Please return to the emergency department if you have another episode.   Please do not drive, go up ladders, or put yourself or others in a dangerous position if you were to have another seizure until you can follow up with neurology.   No driving for 6 months after most recent seizure or spell, per [place] law.   Avoid activities that are potentially dangerous if you were to have another seizure, including operating heavy machinery, swimming, taking baths, climbing heights. Please use direct supervision around stoves, ovens, fireplaces, campfires, or other sources of heat or fire.    Make sure you are taking medications as directed, avoiding alcohol and drug use, getting adequate sleep and eating regular meals.

## 2023-08-10 NOTE — ED Triage Notes (Signed)
EMS brings pt in from home for ?seizure; son heard her yell and found her on ground unresponsive; combative upon arrival of EMS but cleared shortly; no hx of same but had similar episode in past with no dx

## 2023-08-11 DIAGNOSIS — R569 Unspecified convulsions: Secondary | ICD-10-CM | POA: Diagnosis not present

## 2023-08-11 LAB — TROPONIN I (HIGH SENSITIVITY): Troponin I (High Sensitivity): 4 ng/L (ref <18)

## 2023-08-11 LAB — URINALYSIS, ROUTINE W REFLEX MICROSCOPIC
Bilirubin Urine: NEGATIVE
Glucose, UA: NEGATIVE mg/dL
Hgb urine dipstick: NEGATIVE
Ketones, ur: 20 mg/dL — AB
Leukocytes,Ua: NEGATIVE
Nitrite: NEGATIVE
Protein, ur: NEGATIVE mg/dL
Specific Gravity, Urine: 1.03 (ref 1.005–1.030)
pH: 5 (ref 5.0–8.0)

## 2023-08-11 LAB — HCG, QUANTITATIVE, PREGNANCY: hCG, Beta Chain, Quant, S: <1 m[IU]/mL (ref <5)

## 2023-08-11 NOTE — ED Provider Notes (Signed)
1:00 AM  Assumed care at shift change.  Patient here after possible seizure.  Now neurologically intact.  No further seizure-like activity.  CT head read has been pending for quite some time.  Updated patient and family on delays and apologize.  Labs unremarkable other than leukocytosis which is likely reactive.  Troponin x 2 negative.  Urine pending.  Anticipate discharge home with close neurology follow-up.  We discussed the importance of not driving for 6 months after last seizure-like episode.  She would like to hold off on antiepileptics at this time given the 2 events she has had have been years apart until she has seen neurology which I feel is reasonable.  1:22 AM  Head CT per radiology is normal.  Urine shows no sign of infection.  Will discharge home with close neurology follow-up.   At this time, I do not feel there is any life-threatening condition present. I reviewed all nursing notes, vitals, pertinent previous records.  All lab and urine results, EKGs, imaging ordered have been independently reviewed and interpreted by myself.  I reviewed all available radiology reports from any imaging ordered this visit.  Based on my assessment, I feel the patient is safe to be discharged home without further emergent workup and can continue workup as an outpatient as needed. Discussed all findings, treatment plan as well as usual and customary return precautions.  They verbalize understanding and are comfortable with this plan.  Outpatient follow-up has been provided as needed.  All questions have been answered.    Caydence Enck, Layla Maw, DO 08/11/23 [phone]

## 2023-08-15 ENCOUNTER — Encounter: Payer: Self-pay | Admitting: Physician Assistant

## 2023-08-15 ENCOUNTER — Ambulatory Visit (INDEPENDENT_AMBULATORY_CARE_PROVIDER_SITE_OTHER): Payer: BLUE CROSS/BLUE SHIELD | Admitting: Physician Assistant

## 2023-08-15 VITALS — BP 150/100 | HR 85 | Temp 98.4°F | Resp 16 | Ht 67.0 in | Wt 231.6 lb

## 2023-08-15 DIAGNOSIS — E1165 Type 2 diabetes mellitus with hyperglycemia: Secondary | ICD-10-CM | POA: Diagnosis not present

## 2023-08-15 DIAGNOSIS — Z794 Long term (current) use of insulin: Secondary | ICD-10-CM | POA: Diagnosis not present

## 2023-08-15 DIAGNOSIS — I10 Essential (primary) hypertension: Secondary | ICD-10-CM | POA: Diagnosis not present

## 2023-08-15 DIAGNOSIS — Z87898 Personal history of other specified conditions: Secondary | ICD-10-CM | POA: Diagnosis not present

## 2023-08-15 MED ORDER — HYDRALAZINE HCL 10 MG PO TABS
10.0000 mg | ORAL_TABLET | Freq: Three times a day (TID) | ORAL | 2 refills | Status: DC
Start: 2023-08-15 — End: 2023-10-03

## 2023-08-15 NOTE — Progress Notes (Signed)
[facility] [address] [place], [place] [address]  Internal MEDICINE  Office Visit Note  Patient Name: TISH BEGIN  [id number]  [id number]  Date of Service: 08/18/2023     Chief Complaint  Patient presents with   Follow-up    ED follow-up for seizures     HPI Pt is here for recent ED follow up for seizure, daughter-in law is with her and drove her -pt states she had just eaten dinner, taken her meds, and was sitting on the couch relaxing some before bed when her son witnessed her having a seizure. She does not recall any particular feeling or warning prior to event. -Had been up all night in the hospital with her mom prior to her seizure and had been stressed and not eating as well. Sugars had been a little off prior due to not eating as well. -Sugars have been ok at home recently though -This happened once before many years ago. Never started on meds for it and did not see a neurologist back then -CT and labs in ED were ok and opted to hold off on any anti-seizure meds for now. -Neurology soonest appt was Jan, hoping to get in sooner -Concerned about BP being high in office today and repeat readings rising in office likely due to discussion and worrying about it. Will add hydralazine TID prn and will pick this up now an dmonitor closely. Denies SOB or CP and no current headache or vision changes and will monitor. Advised to go to ED if any new or worsening symptoms or if BP not improving with meds. Will follow closely -Pt is under driving restriction for 6 months or until clearance fro neurology due to seizure  Current Medication: Outpatient Encounter Medications as of 08/15/2023  Medication Sig   ALPRAZolam (XANAX) 0.5 MG tablet Take 1 tablet (0.5 mg total) by mouth daily as needed (moderate to severe anxiety).   glimepiride (AMARYL) 4 MG tablet Take 1 tablet (4 mg total) by mouth 2 (two) times daily.   hydrALAZINE (APRESOLINE) 10 MG tablet Take 1 tablet (10  mg total) by mouth 3 (three) times daily.   hydrochlorothiazide (HYDRODIURIL) 25 MG tablet Take 1 tablet (25 mg total) by mouth daily.   insulin NPH Human (NOVOLIN N) 100 UNIT/ML injection Inject 60units [place] QAM and 70 units [place] QPM   lisinopril (ZESTRIL) 40 MG tablet Take 1 tablet by mouth once daily   metFORMIN (GLUCOPHAGE-XR) 500 MG 24 hr tablet Take 2 tablets (1,000 mg total) by mouth 2 (two) times daily.   potassium chloride (KLOR-CON) 10 MEQ tablet Take 1 tablet (10 mEq total) by mouth daily.   rosuvastatin (CRESTOR) 5 MG tablet Take 1 tablet (5 mg total) by mouth daily.   No facility-administered encounter medications on file as of 08/15/2023.    Surgical History: Past Surgical History:  Procedure Laterality Date   CESAREAN SECTION     TUBAL LIGATION      Medical History: Past Medical History:  Diagnosis Date   Diabetes (HCC)    Hypertension     Family History: Family History  Problem Relation Age of Onset   Diabetes Mother    Hyperlipidemia Mother    Hypertension Mother    Heart attack Father    Stroke Maternal Grandmother     Social History   Socioeconomic History   Marital status: Married    Spouse name: Not on file   Number of children: Not on file   Years  of education: Not on file   Highest education level: Not on file  Occupational History   Not on file  Tobacco Use   Smoking status: Former    Current packs/day: 0.00    Types: Cigarettes    Quit date: 09/11/2021    Years since quitting: 1.9   Smokeless tobacco: Never   Tobacco comments:    Quit in Nov.   Vaping Use   Vaping status: Every Day  Substance and Sexual Activity   Alcohol use: No   Drug use: No   Sexual activity: Not on file  Other Topics Concern   Not on file  Social History Narrative   Not on file   Social Determinants of Health   Financial Resource Strain: Not on file  Food Insecurity: Not on file  Transportation Needs: Not on file  Physical Activity: Not on file  Stress:  Not on file  Social Connections: Not on file  Intimate Partner Violence: Not on file      Review of Systems  Constitutional:  Negative for chills, fatigue and unexpected weight change.  HENT:  Negative for congestion, postnasal drip, rhinorrhea, sneezing and sore throat.   Eyes:  Negative for redness.  Respiratory:  Negative for cough, chest tightness and shortness of breath.   Cardiovascular:  Negative for chest pain and palpitations.  Gastrointestinal:  Negative for abdominal pain, constipation, diarrhea, nausea and vomiting.  Genitourinary:  Negative for dysuria and frequency.  Musculoskeletal:  Negative for arthralgias, back pain, joint swelling and neck pain.  Skin:  Negative for rash.  Neurological: Negative.  Negative for tremors and numbness.  Hematological:  Negative for adenopathy. Does not bruise/bleed easily.  Psychiatric/Behavioral:  Positive for sleep disturbance. Negative for behavioral problems (Depression) and suicidal ideas. The patient is nervous/anxious.     Vital Signs: BP (!) 150/100   Pulse 85   Temp 98.4 F (36.9 C)   Resp 16   Ht 5\' 7"  (1.702 m)   Wt 231 lb 9.6 oz (105.1 kg)   LMP 07/26/2023 (Approximate)   SpO2 97%   BMI 36.27 kg/m    Physical Exam Vitals reviewed.  Constitutional:      General: She is not in acute distress.    Appearance: Normal appearance. She is obese. She is not ill-appearing.  HENT:     Head: Normocephalic and atraumatic.  Eyes:     Pupils: Pupils are equal, round, and reactive to light.  Cardiovascular:     Rate and Rhythm: Normal rate and regular rhythm.  Pulmonary:     Effort: Pulmonary effort is normal. No respiratory distress.  Musculoskeletal:        General: Normal range of motion.  Skin:    General: Skin is warm and dry.  Neurological:     Mental Status: She is alert and oriented to person, place, and time.  Psychiatric:        Mood and Affect: Mood normal.     Comments: Anxious in office        Assessment/Plan: 1. History of seizure ED work up normal and decided to hold off on meds, will follow up with neurology as planned. Will continue driving restrictions  2. Essential hypertension, benign Will add hydralazine TID prn and pt will send BP readings to office and follow up in 1 week to ensure better control. Advised to call or go to ED if not improving or if any symptoms develop - hydrALAZINE (APRESOLINE) 10 MG tablet; Take 1 tablet (10  mg total) by mouth 3 (three) times daily.  Dispense: 90 tablet; Refill: 2  3. Type 2 diabetes mellitus with hyperglycemia, with long-term current use of insulin (HCC) Sugars improving again now and will continue current medications and monitoring   General Counseling: geanine vandekamp understanding of the findings of todays visit and agrees with plan of treatment. I have discussed any further diagnostic evaluation that may be needed or ordered today. We also reviewed her medications today. she has been encouraged to call the office with any questions or concerns that should arise related to todays visit.    Counseling:    No orders of the defined types were placed in this encounter.   This patient was seen by Lynn Ito, PA-C in collaboration with Dr. Beverely Risen as a part of collaborative care agreement.   I have reviewed all medical records from hospital follow up including radiology reports and consults from other physicians. Appropriate follow up diagnostics will be scheduled as needed. Patient/ Family understands the plan of treatment. Time spent 35 minutes.   Dr Lyndon Code, MD Internal Medicine

## 2023-08-22 ENCOUNTER — Encounter: Payer: Self-pay | Admitting: Physician Assistant

## 2023-08-22 ENCOUNTER — Ambulatory Visit (INDEPENDENT_AMBULATORY_CARE_PROVIDER_SITE_OTHER): Payer: BLUE CROSS/BLUE SHIELD | Admitting: Physician Assistant

## 2023-08-22 VITALS — BP 128/80 | HR 94 | Temp 98.0°F | Resp 16 | Ht 67.0 in | Wt 229.0 lb

## 2023-08-22 DIAGNOSIS — Z1212 Encounter for screening for malignant neoplasm of rectum: Secondary | ICD-10-CM

## 2023-08-22 DIAGNOSIS — I10 Essential (primary) hypertension: Secondary | ICD-10-CM

## 2023-08-22 DIAGNOSIS — Z1211 Encounter for screening for malignant neoplasm of colon: Secondary | ICD-10-CM | POA: Diagnosis not present

## 2023-08-22 DIAGNOSIS — F411 Generalized anxiety disorder: Secondary | ICD-10-CM | POA: Diagnosis not present

## 2023-08-22 MED ORDER — ALPRAZOLAM 0.5 MG PO TABS
0.5000 mg | ORAL_TABLET | Freq: Every day | ORAL | 0 refills | Status: DC | PRN
Start: 2023-08-22 — End: 2023-10-03

## 2023-08-22 NOTE — Progress Notes (Signed)
[facility] [address] [place], [place] [address]  Internal MEDICINE  Office Visit Note  Patient Name: Vicki Simmons  [id number]  [id number]  Date of Service: 08/22/2023  No chief complaint on file.   HPI Pt is here for 1 week follow up for HTN -BP has been doing better overall with addition of hydralazine TID -Did have an instance of BP in 170s with some chest pressure and just felt off so went to ED on 10/24. Thinks she was having more stress -did have low potassium and was given potassium supplement.  -Has pending labs ordered which can recheck potassium level in the next 1-2 weeks, pt takes OTC potassium currently -Has been taking xanax recently to help with sleep and stress at night and feels this is really helping. She was hesitant to use after seizure due to being aware of caution with this type of med after seizure, but did take this the last few nights and BP has been even better as well. She is aware of risks, but benefit appears to outweigh this at this time, especially since stress and lack of sleep thought to be trigger of seizure 2 weeks ago -Still having some MSK pain along left arm, tender to touch and will certain movement. Shoulder does not hurt, nor does elbow. May go to ortho if still bothersome, will avoiding overuse. Has been applying topicals which help  Current Medication: Outpatient Encounter Medications as of 08/22/2023  Medication Sig   glimepiride (AMARYL) 4 MG tablet Take 1 tablet (4 mg total) by mouth 2 (two) times daily.   hydrALAZINE (APRESOLINE) 10 MG tablet Take 1 tablet (10 mg total) by mouth 3 (three) times daily.   hydrochlorothiazide (HYDRODIURIL) 25 MG tablet Take 1 tablet (25 mg total) by mouth daily.   insulin NPH Human (NOVOLIN N) 100 UNIT/ML injection Inject 60units [place] QAM and 70 units [place] QPM   lisinopril (ZESTRIL) 40 MG tablet Take 1 tablet by mouth once daily   metFORMIN (GLUCOPHAGE-XR) 500 MG 24 hr tablet Take 2 tablets  (1,000 mg total) by mouth 2 (two) times daily.   potassium chloride (KLOR-CON) 10 MEQ tablet Take 1 tablet (10 mEq total) by mouth daily.   rosuvastatin (CRESTOR) 5 MG tablet Take 1 tablet (5 mg total) by mouth daily.   [DISCONTINUED] ALPRAZolam (XANAX) 0.5 MG tablet Take 1 tablet (0.5 mg total) by mouth daily as needed (moderate to severe anxiety).   ALPRAZolam (XANAX) 0.5 MG tablet Take 1 tablet (0.5 mg total) by mouth daily as needed (moderate to severe anxiety).   No facility-administered encounter medications on file as of 08/22/2023.    Surgical History: Past Surgical History:  Procedure Laterality Date   CESAREAN SECTION     TUBAL LIGATION      Medical History: Past Medical History:  Diagnosis Date   Diabetes (HCC)    Hypertension     Family History: Family History  Problem Relation Age of Onset   Diabetes Mother    Hyperlipidemia Mother    Hypertension Mother    Heart attack Father    Stroke Maternal Grandmother     Social History   Socioeconomic History   Marital status: Married    Spouse name: Not on file   Number of children: Not on file   Years of education: Not on file   Highest education level: Not on file  Occupational History   Not on file  Tobacco Use   Smoking status: Former  Current packs/day: 0.00    Types: Cigarettes    Quit date: 09/11/2021    Years since quitting: 1.9   Smokeless tobacco: Never   Tobacco comments:    Quit in Nov.   Vaping Use   Vaping status: Every Day  Substance and Sexual Activity   Alcohol use: No   Drug use: No   Sexual activity: Not on file  Other Topics Concern   Not on file  Social History Narrative   Not on file   Social Determinants of Health   Financial Resource Strain: Not on file  Food Insecurity: Not on file  Transportation Needs: Not on file  Physical Activity: Not on file  Stress: Not on file  Social Connections: Not on file  Intimate Partner Violence: Not on file      Review of  Systems  Constitutional:  Negative for chills, fatigue and unexpected weight change.  HENT:  Negative for congestion, postnasal drip, rhinorrhea, sneezing and sore throat.   Eyes:  Negative for redness.  Respiratory:  Negative for cough, chest tightness and shortness of breath.   Cardiovascular:  Negative for chest pain and palpitations.  Gastrointestinal:  Negative for abdominal pain, constipation, diarrhea, nausea and vomiting.  Genitourinary:  Negative for dysuria and frequency.  Musculoskeletal:  Positive for myalgias. Negative for arthralgias, back pain, joint swelling and neck pain.  Skin:  Negative for rash.  Neurological: Negative.  Negative for tremors and numbness.  Hematological:  Negative for adenopathy. Does not bruise/bleed easily.  Psychiatric/Behavioral:  Positive for sleep disturbance. Negative for behavioral problems (Depression) and suicidal ideas. The patient is nervous/anxious.     Vital Signs: BP 128/80   Pulse 94   Temp 98 F (36.7 C)   Resp 16   Ht 5\' 7"  (1.702 m)   Wt 229 lb (103.9 kg)   LMP 07/26/2023 (Approximate)   SpO2 96%   BMI 35.87 kg/m    Physical Exam Vitals and nursing note reviewed.  Constitutional:      General: She is not in acute distress.    Appearance: Normal appearance. She is obese. She is not ill-appearing.  HENT:     Head: Normocephalic and atraumatic.  Eyes:     Pupils: Pupils are equal, round, and reactive to light.  Cardiovascular:     Rate and Rhythm: Normal rate and regular rhythm.  Pulmonary:     Effort: Pulmonary effort is normal. No respiratory distress.  Musculoskeletal:        General: Normal range of motion.  Skin:    General: Skin is warm and dry.  Neurological:     Mental Status: She is alert and oriented to person, place, and time.  Psychiatric:        Mood and Affect: Mood normal.        Behavior: Behavior normal.        Assessment/Plan: 1. Essential hypertension, benign Much better controlled and  will continue to monitor. Did discuss she can take extra hydralazine if needed for high BP  2. Generalized anxiety disorder - ALPRAZolam (XANAX) 0.5 MG tablet; Take 1 tablet (0.5 mg total) by mouth daily as needed (moderate to severe anxiety).  Dispense: 30 tablet; Refill: 0  3. Screening for colorectal cancer - Cologuard   General Counseling: sovereign branstetter understanding of the findings of todays visit and agrees with plan of treatment. I have discussed any further diagnostic evaluation that may be needed or ordered today. We also reviewed her medications today. she has  been encouraged to call the office with any questions or concerns that should arise related to todays visit.    Orders Placed This Encounter  Procedures   Cologuard    Meds ordered this encounter  Medications   ALPRAZolam (XANAX) 0.5 MG tablet    Sig: Take 1 tablet (0.5 mg total) by mouth daily as needed (moderate to severe anxiety).    Dispense:  30 tablet    Refill:  0    Please fill today, new script    This patient was seen by Lynn Ito, PA-C in collaboration with Dr. Beverely Risen as a part of collaborative care agreement.   Total time spent:30 Minutes Time spent includes review of chart, medications, test results, and follow up plan with the patient.      Dr Lyndon Code Internal medicine

## 2023-09-04 ENCOUNTER — Other Ambulatory Visit: Payer: Self-pay | Admitting: Physician Assistant

## 2023-09-04 DIAGNOSIS — I10 Essential (primary) hypertension: Secondary | ICD-10-CM

## 2023-09-11 LAB — COLOGUARD

## 2023-09-28 ENCOUNTER — Other Ambulatory Visit: Payer: Self-pay | Admitting: Physician Assistant

## 2023-09-28 DIAGNOSIS — I10 Essential (primary) hypertension: Secondary | ICD-10-CM

## 2023-09-28 DIAGNOSIS — E782 Mixed hyperlipidemia: Secondary | ICD-10-CM

## 2023-09-28 LAB — COMPREHENSIVE METABOLIC PANEL
ALT: 25 [IU]/L (ref 0–32)
AST: 19 [IU]/L (ref 0–40)
Albumin: 4.4 g/dL (ref 3.9–4.9)
Alkaline Phosphatase: 51 [IU]/L (ref 44–121)
BUN/Creatinine Ratio: 21 (ref 9–23)
BUN: 13 mg/dL (ref 6–24)
Bilirubin Total: <0.2 mg/dL (ref 0.0–1.2)
CO2: 23 mmol/L (ref 20–29)
Calcium: 9.4 mg/dL (ref 8.7–10.2)
Chloride: 98 mmol/L (ref 96–106)
Creatinine, Ser: 0.63 mg/dL (ref 0.57–1.00)
Globulin, Total: 2 g/dL (ref 1.5–4.5)
Glucose: 149 mg/dL — ABNORMAL HIGH (ref 70–99)
Potassium: 3.7 mmol/L (ref 3.5–5.2)
Sodium: 138 mmol/L (ref 134–144)
Total Protein: 6.4 g/dL (ref 6.0–8.5)
eGFR: 111 mL/min/{1.73_m2} (ref >59)

## 2023-09-28 LAB — CBC WITH DIFFERENTIAL/PLATELET
Basophils Absolute: 0 10*3/uL (ref 0.0–0.2)
Basos: 0 %
EOS (ABSOLUTE): 0.2 10*3/uL (ref 0.0–0.4)
Eos: 2 %
Hematocrit: 36.6 % (ref 34.0–46.6)
Hemoglobin: 12 g/dL (ref 11.1–15.9)
Immature Grans (Abs): 0 10*3/uL (ref 0.0–0.1)
Immature Granulocytes: 0 %
Lymphocytes Absolute: 2.6 10*3/uL (ref 0.7–3.1)
Lymphs: 26 %
MCH: 30.2 pg (ref 26.6–33.0)
MCHC: 32.8 g/dL (ref 31.5–35.7)
MCV: 92 fL (ref 79–97)
Monocytes Absolute: 0.7 10*3/uL (ref 0.1–0.9)
Monocytes: 7 %
Neutrophils Absolute: 6.4 10*3/uL (ref 1.4–7.0)
Neutrophils: 65 %
Platelets: 375 10*3/uL (ref 150–450)
RBC: 3.97 x10E6/uL (ref 3.77–5.28)
RDW: 12.3 % (ref 11.7–15.4)
WBC: 10.1 10*3/uL (ref 3.4–10.8)

## 2023-09-28 LAB — LIPID PANEL WITH LDL/HDL RATIO
Cholesterol, Total: 188 mg/dL (ref 100–199)
HDL: 39 mg/dL — ABNORMAL LOW (ref >39)
LDL Chol Calc (NIH): 87 mg/dL (ref 0–99)
LDL/HDL Ratio: 2.2 {ratio} (ref 0.0–3.2)
Triglycerides: 379 mg/dL — ABNORMAL HIGH (ref 0–149)
VLDL Cholesterol Cal: 62 mg/dL — ABNORMAL HIGH (ref 5–40)

## 2023-09-28 LAB — TSH+FREE T4
Free T4: 0.99 ng/dL (ref 0.82–1.77)
TSH: 1.46 u[IU]/mL (ref 0.450–4.500)

## 2023-09-28 LAB — HGB A1C W/O EAG: Hgb A1c MFr Bld: 7.4 % — ABNORMAL HIGH (ref 4.8–5.6)

## 2023-10-03 ENCOUNTER — Encounter: Payer: Self-pay | Admitting: Physician Assistant

## 2023-10-03 ENCOUNTER — Ambulatory Visit (INDEPENDENT_AMBULATORY_CARE_PROVIDER_SITE_OTHER): Payer: BLUE CROSS/BLUE SHIELD | Admitting: Physician Assistant

## 2023-10-03 VITALS — BP 141/65 | HR 82 | Temp 98.7°F | Resp 16 | Ht 67.0 in | Wt 232.2 lb

## 2023-10-03 DIAGNOSIS — R300 Dysuria: Secondary | ICD-10-CM

## 2023-10-03 DIAGNOSIS — Z794 Long term (current) use of insulin: Secondary | ICD-10-CM

## 2023-10-03 DIAGNOSIS — I10 Essential (primary) hypertension: Secondary | ICD-10-CM

## 2023-10-03 DIAGNOSIS — E1165 Type 2 diabetes mellitus with hyperglycemia: Secondary | ICD-10-CM | POA: Diagnosis not present

## 2023-10-03 DIAGNOSIS — F411 Generalized anxiety disorder: Secondary | ICD-10-CM

## 2023-10-03 DIAGNOSIS — Z0001 Encounter for general adult medical examination with abnormal findings: Secondary | ICD-10-CM | POA: Diagnosis not present

## 2023-10-03 MED ORDER — HYDRALAZINE HCL 25 MG PO TABS
25.0000 mg | ORAL_TABLET | Freq: Two times a day (BID) | ORAL | 2 refills | Status: DC
Start: 2023-10-03 — End: 2023-12-22

## 2023-10-03 MED ORDER — ALPRAZOLAM 0.5 MG PO TABS: 0.5000 mg | ORAL_TABLET | Freq: Every day | ORAL | 0 refills | Status: DC | PRN

## 2023-10-03 NOTE — Progress Notes (Signed)
[facility] [address] [place], [place] [address]  Internal MEDICINE  Office Visit Note  Patient Name: Vicki Simmons  [id number]  [id number]  Date of Service: 10/03/2023  Chief Complaint  Patient presents with   Annual Exam   Diabetes   Hypertension     HPI Pt is here for routine health maintenance examination -BP at home sometimes in 140s at highest, occasional 120-130, but often around 140 systolic. Does have hard time remembering midday hydralazine until early afternoon. Discussed trial of increased dose BID and using 10mg  midday if needed. BP 148/80 on repeat -BG has been improving since October, getting back into routine. Did have one morning above 200 after eating poorly and late in evening. A1c elevated but includes timeframe of very elevated readings/hospital visit and was to be expected. Improving now -taking 65 units insulin BID, amaryl BID,  metformin BID. Previously SGLT2 and GLP1 not covered well but may try again in new year if needed -TG and sugar elevated on labs, but otherwise looks good -Stress may be impacting BP and BG currently, lost her mom recently which is stressful at the holidays, but also may be stress reliever in future not worrying about mom's health which is what she was dealing with at time of seizure. She states she is managing and has good moments and bad moments -cologuard redone and has been mailed in, no results yet -Neurology appt next week, has not been driving since seizure and is hopeful to be cleared for this by neurology -declines mammogram at this time  Current Medication: Outpatient Encounter Medications as of 10/03/2023  Medication Sig   glimepiride (AMARYL) 4 MG tablet Take 1 tablet (4 mg total) by mouth 2 (two) times daily.   hydrALAZINE (APRESOLINE) 25 MG tablet Take 1 tablet (25 mg total) by mouth 2 (two) times daily.   hydrochlorothiazide (HYDRODIURIL) 25 MG tablet Take 1 tablet by mouth once daily   insulin NPH  Human (NOVOLIN N) 100 UNIT/ML injection Inject 60units [place] QAM and 70 units [place] QPM   lisinopril (ZESTRIL) 40 MG tablet Take 1 tablet by mouth once daily   metFORMIN (GLUCOPHAGE-XR) 500 MG 24 hr tablet Take 2 tablets (1,000 mg total) by mouth 2 (two) times daily.   potassium chloride (KLOR-CON) 10 MEQ tablet Take 1 tablet (10 mEq total) by mouth daily.   rosuvastatin (CRESTOR) 5 MG tablet Take 1 tablet by mouth once daily   [DISCONTINUED] ALPRAZolam (XANAX) 0.5 MG tablet Take 1 tablet (0.5 mg total) by mouth daily as needed (moderate to severe anxiety).   [DISCONTINUED] hydrALAZINE (APRESOLINE) 10 MG tablet Take 1 tablet (10 mg total) by mouth 3 (three) times daily.   ALPRAZolam (XANAX) 0.5 MG tablet Take 1 tablet (0.5 mg total) by mouth daily as needed (moderate to severe anxiety).   No facility-administered encounter medications on file as of 10/03/2023.    Surgical History: Past Surgical History:  Procedure Laterality Date   CESAREAN SECTION     TUBAL LIGATION      Medical History: Past Medical History:  Diagnosis Date   Diabetes (HCC)    Hypertension     Family History: Family History  Problem Relation Age of Onset   Diabetes Mother    Hyperlipidemia Mother    Hypertension Mother    Heart attack Father    Stroke Maternal Grandmother       Review of Systems  Constitutional:  Negative for chills, fatigue and unexpected weight change.  HENT:  Negative for congestion, postnasal drip, rhinorrhea, sneezing and sore throat.   Eyes:  Negative for redness.  Respiratory:  Negative for cough, chest tightness and shortness of breath.   Cardiovascular:  Negative for chest pain and palpitations.  Gastrointestinal:  Negative for abdominal pain, constipation, diarrhea, nausea and vomiting.  Genitourinary:  Negative for dysuria and frequency.  Musculoskeletal:  Negative for arthralgias, back pain, joint swelling and neck pain.  Skin:  Negative for rash.  Neurological: Negative.   Negative for tremors and numbness.  Hematological:  Negative for adenopathy. Does not bruise/bleed easily.  Psychiatric/Behavioral:  Negative for behavioral problems (Depression) and suicidal ideas. The patient is nervous/anxious.      Vital Signs: BP (!) 141/65   Pulse 82   Temp 98.7 F (37.1 C)   Resp 16   Ht 5\' 7"  (1.702 m)   Wt 232 lb 3.2 oz (105.3 kg)   SpO2 98%   BMI 36.37 kg/m    Physical Exam Vitals and nursing note reviewed.  Constitutional:      General: She is not in acute distress.    Appearance: Normal appearance. She is obese. She is not ill-appearing.  HENT:     Head: Normocephalic and atraumatic.  Eyes:     Pupils: Pupils are equal, round, and reactive to light.  Cardiovascular:     Rate and Rhythm: Normal rate and regular rhythm.     Pulses:          Dorsalis pedis pulses are 3+ on the right side and 3+ on the left side.       Posterior tibial pulses are 3+ on the right side and 3+ on the left side.  Pulmonary:     Effort: Pulmonary effort is normal. No respiratory distress.  Musculoskeletal:        General: Normal range of motion.     Right foot: Normal range of motion.     Left foot: Normal range of motion.  Feet:     Right foot:     Protective Sensation: 2 sites tested.  2 sites sensed.     Toenail Condition: Right toenails are normal.     Left foot:     Protective Sensation: 2 sites tested.  2 sites sensed.     Skin integrity: Skin integrity normal.     Toenail Condition: Left toenails are normal.     Comments: Skin irritation along right second toe, likely eczema Skin:    General: Skin is warm and dry.  Neurological:     Mental Status: She is alert and oriented to person, place, and time.  Psychiatric:        Mood and Affect: Mood normal.        Behavior: Behavior normal.      LABS: Recent Results (from the past 2160 hour(s))  CBG monitoring, ED     Status: Abnormal   Collection Time: 08/10/23  8:46 PM  Result Value Ref Range    Glucose-Capillary 147 (H) 70 - 99 mg/dL    Comment: Glucose reference range applies only to samples taken after fasting for at least 8 hours.  Basic metabolic panel     Status: Abnormal   Collection Time: 08/10/23  8:50 PM  Result Value Ref Range   Sodium 137 135 - 145 mmol/L   Potassium 3.2 (L) 3.5 - 5.1 mmol/L   Chloride 98 98 - 111 mmol/L   CO2 25 22 - 32 mmol/L   Glucose, Bld 158 (H) 70 -  99 mg/dL    Comment: Glucose reference range applies only to samples taken after fasting for at least 8 hours.   BUN 11 6 - 20 mg/dL   Creatinine, Ser 1.61 0.44 - 1.00 mg/dL   Calcium 9.4 8.9 - 09.6 mg/dL   GFR, Estimated >04 >54 mL/min    Comment: (NOTE) Calculated using the CKD-EPI Creatinine Equation (2021)    Anion gap 14 5 - 15    Comment: Performed at [facility], [address] [place], [place] [address]  CBC     Status: Abnormal   Collection Time: 08/10/23  8:50 PM  Result Value Ref Range   WBC 15.4 (H) 4.0 - 10.5 K/uL   RBC 4.29 3.87 - 5.11 MIL/uL   Hemoglobin 12.9 12.0 - 15.0 g/dL   HCT 91.4 78.2 - 95.6 %   MCV 88.6 80.0 - 100.0 fL   MCH 30.1 26.0 - 34.0 pg   MCHC 33.9 30.0 - 36.0 g/dL   RDW 21.3 08.6 - 57.8 %   Platelets 411 (H) 150 - 400 K/uL   nRBC 0.0 0.0 - 0.2 %    Comment: Performed at [facility], [address] [place], [place] [address]  hCG, quantitative, pregnancy     Status: None   Collection Time: 08/10/23  8:50 PM  Result Value Ref Range   hCG, Beta Chain, Quant, S <1 <5 mIU/mL    Comment:          GEST. AGE      CONC.  (mIU/mL)   <=1 WEEK        5 - 50     2 WEEKS       50 - 500     3 WEEKS       100 - 10,000     4 WEEKS     1,000 - 30,000     5 WEEKS     3,500 - 115,000   6-8 WEEKS     12,000 - 270,000    12 WEEKS     15,000 - 220,000        FEMALE AND NON-PREGNANT FEMALE:     LESS THAN 5 mIU/mL Performed at [facility], [address] [place], [place] [address]   Magnesium     Status: None   Collection Time:  08/10/23  8:52 PM  Result Value Ref Range   Magnesium 1.9 1.7 - 2.4 mg/dL    Comment: Performed at [facility], [address] [place], [place] [address]  Troponin I (High Sensitivity)     Status: None   Collection Time: 08/10/23  8:52 PM  Result Value Ref Range   Troponin I (High Sensitivity) 4 <18 ng/L    Comment: (NOTE) Elevated high sensitivity troponin I (hsTnI) values and significant  changes across serial measurements may suggest ACS but many other  chronic and acute conditions are known to elevate hsTnI results.  Refer to the "Links" section for chest pain algorithms and additional  guidance. Performed at [facility], [address] [place], [place] [address]   Urinalysis, Routine w reflex microscopic -Urine, Clean Catch     Status: Abnormal   Collection Time: 08/11/23 12:25 AM  Result Value Ref Range   Color, Urine YELLOW (A) YELLOW   APPearance HAZY (A) CLEAR   Specific Gravity, Urine 1.030 1.005 - 1.030   pH 5.0 5.0 - 8.0   Glucose, UA NEGATIVE NEGATIVE mg/dL   Hgb urine dipstick  NEGATIVE NEGATIVE   Bilirubin Urine NEGATIVE NEGATIVE   Ketones, ur 20 (A) NEGATIVE mg/dL   Protein, ur NEGATIVE NEGATIVE mg/dL   Nitrite NEGATIVE NEGATIVE   Leukocytes,Ua NEGATIVE NEGATIVE    Comment: Performed at [facility], [address] [place], [place] [address]  Troponin I (High Sensitivity)     Status: None   Collection Time: 08/11/23 12:25 AM  Result Value Ref Range   Troponin I (High Sensitivity) 4 <18 ng/L    Comment: (NOTE) Elevated high sensitivity troponin I (hsTnI) values and significant  changes across serial measurements may suggest ACS but many other  chronic and acute conditions are known to elevate hsTnI results.  Refer to the "Links" section for chest pain algorithms and additional  guidance. Performed at [facility], [address] [place], [place] [address]   Cologuard     Status: None   Collection Time: 08/31/23  11:08 AM  Result Value Ref Range   COLOGUARD No Result Obtained 3 N/A    Comment: There was insufficient stool DNA detected to produce a valid Cologuard result. The patient will be contacted to initiate a new sample collection.  CBC w/Diff/Platelet     Status: None   Collection Time: 09/27/23  4:03 PM  Result Value Ref Range   WBC 10.1 3.4 - 10.8 x10E3/uL   RBC 3.97 3.77 - 5.28 x10E6/uL   Hemoglobin 12.0 11.1 - 15.9 g/dL   Hematocrit 13.0 86.5 - 46.6 %   MCV 92 79 - 97 fL   MCH 30.2 26.6 - 33.0 pg   MCHC 32.8 31.5 - 35.7 g/dL   RDW 78.4 69.6 - 29.5 %   Platelets 375 150 - 450 x10E3/uL   Neutrophils 65 Not Estab. %   Lymphs 26 Not Estab. %   Monocytes 7 Not Estab. %   Eos 2 Not Estab. %   Basos 0 Not Estab. %   Neutrophils Absolute 6.4 1.4 - 7.0 x10E3/uL   Lymphocytes Absolute 2.6 0.7 - 3.1 x10E3/uL   Monocytes Absolute 0.7 0.1 - 0.9 x10E3/uL   EOS (ABSOLUTE) 0.2 0.0 - 0.4 x10E3/uL   Basophils Absolute 0.0 0.0 - 0.2 x10E3/uL   Immature Granulocytes 0 Not Estab. %   Immature Grans (Abs) 0.0 0.0 - 0.1 x10E3/uL  Comprehensive metabolic panel     Status: Abnormal   Collection Time: 09/27/23  4:03 PM  Result Value Ref Range   Glucose 149 (H) 70 - 99 mg/dL   BUN 13 6 - 24 mg/dL   Creatinine, Ser 2.84 0.57 - 1.00 mg/dL   eGFR 132 >44 WN/UUV/2.53   BUN/Creatinine Ratio 21 9 - 23   Sodium 138 134 - 144 mmol/L   Potassium 3.7 3.5 - 5.2 mmol/L   Chloride 98 96 - 106 mmol/L   CO2 23 20 - 29 mmol/L   Calcium 9.4 8.7 - 10.2 mg/dL   Total Protein 6.4 6.0 - 8.5 g/dL   Albumin 4.4 3.9 - 4.9 g/dL   Globulin, Total 2.0 1.5 - 4.5 g/dL   Bilirubin Total <6.6 0.0 - 1.2 mg/dL   Alkaline Phosphatase 51 44 - 121 IU/L   AST 19 0 - 40 IU/L   ALT 25 0 - 32 IU/L  TSH + free T4     Status: None   Collection Time: 09/27/23  4:03 PM  Result Value Ref Range   TSH 1.460 0.450 - 4.500 uIU/mL   Free T4 0.99 0.82 - 1.77 ng/dL  Lipid Panel With  LDL/HDL Ratio     Status: Abnormal   Collection Time:  09/27/23  4:03 PM  Result Value Ref Range   Cholesterol, Total 188 100 - 199 mg/dL   Triglycerides 409 (H) 0 - 149 mg/dL   HDL 39 (L) >81 mg/dL   VLDL Cholesterol Cal 62 (H) 5 - 40 mg/dL   LDL Chol Calc (NIH) 87 0 - 99 mg/dL   LDL/HDL Ratio 2.2 0.0 - 3.2 ratio    Comment:                                     LDL/HDL Ratio                                             Men  Women                               1/2 Avg.Risk  1.0    1.5                                   Avg.Risk  3.6    3.2                                2X Avg.Risk  6.2    5.0                                3X Avg.Risk  8.0    6.1   Hgb A1C w/o eAG     Status: Abnormal   Collection Time: 09/27/23  4:03 PM  Result Value Ref Range   Hgb A1c MFr Bld 7.4 (H) 4.8 - 5.6 %    Comment:          Prediabetes: 5.7 - 6.4          Diabetes: >6.4          Glycemic control for adults with diabetes: <7.0         Assessment/Plan: 1. Encounter for general adult medical examination with abnormal findings CPE performed, labs reviewed, declines mammogram, turned in repeat cologuard sample  2. Type 2 diabetes mellitus with hyperglycemia, with long-term current use of insulin (HCC) A1c elevated likely due to recent spike in sugars while caring for mom, back to improving diet and exercise and monitoring closely.  - Urine Microalbumin w/creat. ratio  3. Essential hypertension, benign Will adjust to increased hydralazine dose BID and monitor, may need further adjustment. Has 10mg  dose at home if needed - hydrALAZINE (APRESOLINE) 25 MG tablet; Take 1 tablet (25 mg total) by mouth 2 (two) times daily.  Dispense: 60 tablet; Refill: 2  4. Generalized anxiety disorder - ALPRAZolam (XANAX) 0.5 MG tablet; Take 1 tablet (0.5 mg total) by mouth daily as needed (moderate to severe anxiety).  Dispense: 30 tablet; Refill: 0  5. Dysuria - UA/M w/rflx Culture, Routine   General Counseling: Letitia Neri understanding of the findings of todays  visit and agrees with plan of treatment. I have discussed any further diagnostic evaluation that may be needed or ordered  today. We also reviewed her medications today. she has been encouraged to call the office with any questions or concerns that should arise related to todays visit.    Counseling:    Orders Placed This Encounter  Procedures   Urine Microalbumin w/creat. ratio   UA/M w/rflx Culture, Routine    Meds ordered this encounter  Medications   ALPRAZolam (XANAX) 0.5 MG tablet    Sig: Take 1 tablet (0.5 mg total) by mouth daily as needed (moderate to severe anxiety).    Dispense:  30 tablet    Refill:  0    Please fill today, new script   hydrALAZINE (APRESOLINE) 25 MG tablet    Sig: Take 1 tablet (25 mg total) by mouth 2 (two) times daily.    Dispense:  60 tablet    Refill:  2    This patient was seen by Lynn Ito, PA-C in collaboration with Dr. Beverely Risen as a part of collaborative care agreement.  Total time spent:35 Minutes  Time spent includes review of chart, medications, test results, and follow up plan with the patient.     Lyndon Code, MD  Internal Medicine

## 2023-10-04 LAB — UA/M W/RFLX CULTURE, ROUTINE

## 2023-10-04 LAB — MICROALBUMIN / CREATININE URINE RATIO
Creatinine, Urine: 184.7 mg/dL
Microalb/Creat Ratio: 11 mg/g{creat} (ref 0–29)
Microalbumin, Urine: 19.6 ug/mL

## 2023-10-05 LAB — UA/M W/RFLX CULTURE, ROUTINE
Urobilinogen, Ur: 0.2 mg/dL (ref 0.2–1.0)
pH, UA: 6 (ref 5.0–7.5)

## 2023-10-05 LAB — MICROSCOPIC EXAMINATION
Bacteria, UA: NONE SEEN
Casts: NONE SEEN /[LPF]
Epithelial Cells (non renal): NONE SEEN /[HPF] (ref 0–10)
WBC, UA: NONE SEEN /[HPF] (ref 0–5)

## 2023-10-05 LAB — SPECIMEN STATUS REPORT

## 2023-10-05 LAB — COLOGUARD: COLOGUARD: NEGATIVE

## 2023-10-31 ENCOUNTER — Telehealth: Payer: Self-pay | Admitting: Physician Assistant

## 2023-10-31 ENCOUNTER — Ambulatory Visit (INDEPENDENT_AMBULATORY_CARE_PROVIDER_SITE_OTHER): Payer: BLUE CROSS/BLUE SHIELD | Admitting: Physician Assistant

## 2023-10-31 ENCOUNTER — Encounter: Payer: Self-pay | Admitting: Physician Assistant

## 2023-10-31 VITALS — BP 148/90 | HR 95 | Temp 97.8°F | Resp 16 | Ht 67.0 in | Wt 237.0 lb

## 2023-10-31 DIAGNOSIS — E782 Mixed hyperlipidemia: Secondary | ICD-10-CM

## 2023-10-31 DIAGNOSIS — I10 Essential (primary) hypertension: Secondary | ICD-10-CM

## 2023-10-31 DIAGNOSIS — Z87898 Personal history of other specified conditions: Secondary | ICD-10-CM | POA: Diagnosis not present

## 2023-10-31 DIAGNOSIS — E1165 Type 2 diabetes mellitus with hyperglycemia: Secondary | ICD-10-CM | POA: Diagnosis not present

## 2023-10-31 DIAGNOSIS — Z794 Long term (current) use of insulin: Secondary | ICD-10-CM

## 2023-10-31 DIAGNOSIS — G4710 Hypersomnia, unspecified: Secondary | ICD-10-CM

## 2023-10-31 DIAGNOSIS — E669 Obesity, unspecified: Secondary | ICD-10-CM

## 2023-10-31 MED ORDER — GLIMEPIRIDE 4 MG PO TABS
4.0000 mg | ORAL_TABLET | Freq: Two times a day (BID) | ORAL | 1 refills | Status: DC
Start: 2023-10-31 — End: 2023-12-22

## 2023-10-31 MED ORDER — HYDROCHLOROTHIAZIDE 25 MG PO TABS
25.0000 mg | ORAL_TABLET | Freq: Every day | ORAL | 1 refills | Status: DC
Start: 2023-10-31 — End: 2024-06-01

## 2023-10-31 MED ORDER — METFORMIN HCL ER 500 MG PO TB24
1000.0000 mg | ORAL_TABLET | Freq: Two times a day (BID) | ORAL | 1 refills | Status: DC
Start: 2023-10-31 — End: 2023-12-22

## 2023-10-31 MED ORDER — ROSUVASTATIN CALCIUM 5 MG PO TABS
5.0000 mg | ORAL_TABLET | Freq: Every day | ORAL | 1 refills | Status: DC
Start: 2023-10-31 — End: 2023-12-22

## 2023-10-31 MED ORDER — LISINOPRIL 40 MG PO TABS
ORAL_TABLET | ORAL | 1 refills | Status: DC
Start: 2023-10-31 — End: 2024-06-04

## 2023-10-31 NOTE — Telephone Encounter (Signed)
 SS order faxed to [facility]; [phone]

## 2023-10-31 NOTE — Progress Notes (Signed)
 [facility] [address] [place], [place] [address]  Internal MEDICINE  Office Visit Note  Patient Name: Vicki Simmons  [id number]  [id number]  Date of Service: 10/31/2023  Chief Complaint  Patient presents with   Follow-up   Hypertension   Diabetes    HPI Pt is here for routine follow up, her husband is with her -Did see neurology on 10/11/23 to establish care for hx of seizure -Neurology has ordered an EEG, but started her on effexor to help with stress and headaches. She states she only took it one time on 12/23 then forgot it on 12/24 and then had a seizure next day and headaches now worse. -Had seizure on Christmas Day, called neurology office the next day but hasn't heard back still. This was only reoccurrence since original seizure in October and states this wasn't as bad. Has not taking any further effexor due to concerns with this. -Called EMS and was checked out, but was back alert and did not go to ED, BP was very elevated when checked after incident -BG had been doing better, was elevated this morning after late night snack. Prior to this morning was 100-130 in AM -BP 140-150s since seizure, had been doing a little better prior to seizure around 120s-130s on higher dose hydralazine . Does still have the 10mg  dose if needed and will take this at lunch today due to elevated reading. May need to increase to 25mg  TID instead -Husband and pt both report worsening sleep and fatigue since most recent seizure. Pt feels exhausted. Taking decorations down was draining.  -having worse snoring and sleep quality, waking fatigue. Snoring worse in supine and tries to avoid this but has shoulder pain. Will need to move forward with PSG as OSA could contribute to rising HTN, headaches, fatigue and difficulty losing weight.. EPWORTH SLEEPINESS SCALE:  Scale:  (0)= no chance of dozing; (1)= slight chance of dozing; (2)= moderate chance of dozing; (3)= high chance of  dozing  Chance  Situtation    Sitting and reading: 3    Watching TV: 3    Sitting Inactive in public: 1    As a passenger in car: 3      Lying down to rest: 3    Sitting and talking: 0    Sitting quielty after lunch: 3    In a car, stopped in traffic: 2   TOTAL SCORE:   18 out of 24    Current Medication: Outpatient Encounter Medications as of 10/31/2023  Medication Sig   ALPRAZolam  (XANAX ) 0.5 MG tablet Take 1 tablet (0.5 mg total) by mouth daily as needed (moderate to severe anxiety).   hydrALAZINE  (APRESOLINE ) 25 MG tablet Take 1 tablet (25 mg total) by mouth 2 (two) times daily.   insulin  NPH Human (NOVOLIN N) 100 UNIT/ML injection Inject 60units [place] QAM and 70 units  QPM   potassium chloride  (KLOR-CON ) 10 MEQ tablet Take 1 tablet (10 mEq total) by mouth daily.   [DISCONTINUED] glimepiride  (AMARYL ) 4 MG tablet Take 1 tablet (4 mg total) by mouth 2 (two) times daily.   [DISCONTINUED] hydrochlorothiazide  (HYDRODIURIL ) 25 MG tablet Take 1 tablet by mouth once daily   [DISCONTINUED] lisinopril  (ZESTRIL ) 40 MG tablet Take 1 tablet by mouth once daily   [DISCONTINUED] metFORMIN  (GLUCOPHAGE -XR) 500 MG 24 hr tablet Take 2 tablets (1,000 mg total) by mouth 2 (two) times daily.   [DISCONTINUED] rosuvastatin  (CRESTOR ) 5 MG tablet Take 1 tablet by mouth once daily  glimepiride  (AMARYL ) 4 MG tablet Take 1 tablet (4 mg total) by mouth 2 (two) times daily.   hydrochlorothiazide  (HYDRODIURIL ) 25 MG tablet Take 1 tablet (25 mg total) by mouth daily.   lisinopril  (ZESTRIL ) 40 MG tablet Take 1 tablet by mouth once daily   metFORMIN  (GLUCOPHAGE -XR) 500 MG 24 hr tablet Take 2 tablets (1,000 mg total) by mouth 2 (two) times daily.   rosuvastatin  (CRESTOR ) 5 MG tablet Take 1 tablet (5 mg total) by mouth daily.   venlafaxine XR (EFFEXOR-XR) 75 MG 24 hr capsule Take 75 mg by mouth. (Patient not taking: Reported on 10/31/2023)   No facility-administered encounter medications on file as of  10/31/2023.    Surgical History: Past Surgical History:  Procedure Laterality Date   CESAREAN SECTION     TUBAL LIGATION      Medical History: Past Medical History:  Diagnosis Date   Diabetes (HCC)    Hypertension     Family History: Family History  Problem Relation Age of Onset   Diabetes Mother    Hyperlipidemia Mother    Hypertension Mother    Heart attack Father    Stroke Maternal Grandmother     Social History   Socioeconomic History   Marital status: Married    Spouse name: Not on file   Number of children: Not on file   Years of education: Not on file   Highest education level: Not on file  Occupational History   Not on file  Tobacco Use   Smoking status: Former    Current packs/day: 0.00    Types: Cigarettes    Quit date: 09/11/2021    Years since quitting: 2.1   Smokeless tobacco: Never   Tobacco comments:    Quit in Nov.   Vaping Use   Vaping status: Every Day  Substance and Sexual Activity   Alcohol use: No   Drug use: No   Sexual activity: Not on file  Other Topics Concern   Not on file  Social History Narrative   Not on file   Social Drivers of Health   Financial Resource Strain: Not on file  Food Insecurity: Not on file  Transportation Needs: Not on file  Physical Activity: Not on file  Stress: Not on file  Social Connections: Not on file  Intimate Partner Violence: Not on file      Review of Systems  Constitutional:  Positive for activity change and fatigue. Negative for chills and unexpected weight change.  HENT:  Negative for congestion, postnasal drip, rhinorrhea, sneezing and sore throat.   Eyes:  Negative for redness.  Respiratory:  Positive for apnea. Negative for cough, chest tightness and shortness of breath.   Cardiovascular:  Negative for chest pain and palpitations.  Gastrointestinal:  Negative for abdominal pain, constipation, diarrhea, nausea and vomiting.  Genitourinary:  Negative for dysuria and frequency.   Musculoskeletal:  Positive for arthralgias. Negative for back pain, joint swelling and neck pain.  Skin:  Negative for rash.  Neurological: Negative.  Negative for tremors and numbness.  Hematological:  Negative for adenopathy. Does not bruise/bleed easily.  Psychiatric/Behavioral:  Positive for sleep disturbance. Negative for behavioral problems (Depression) and suicidal ideas. The patient is nervous/anxious.     Vital Signs: BP (!) 148/90   Pulse 95   Temp 97.8 F (36.6 C)   Resp 16   Ht 5' 7 (1.702 m)   Wt 237 lb (107.5 kg)   SpO2 97%   BMI 37.12 kg/m  Physical Exam Vitals and nursing note reviewed.  Constitutional:      General: She is not in acute distress.    Appearance: Normal appearance. She is obese. She is not ill-appearing.  HENT:     Head: Normocephalic and atraumatic.  Eyes:     Pupils: Pupils are equal, round, and reactive to light.  Cardiovascular:     Rate and Rhythm: Normal rate and regular rhythm.  Pulmonary:     Effort: Pulmonary effort is normal. No respiratory distress.  Musculoskeletal:        General: Normal range of motion.  Skin:    General: Skin is warm and dry.  Neurological:     General: No focal deficit present.     Mental Status: She is alert and oriented to person, place, and time.     Gait: Gait normal.  Psychiatric:        Mood and Affect: Mood normal.        Behavior: Behavior normal.        Assessment/Plan: 1. Hypersomnia (Primary) Due to snoring, witnessed gasping in sleep, daytime sleepiness, rising HTN, and elevated BMI will order PSG for further evaluation. - PSG SLEEP STUDY; Future  2. History of seizure Pt will contact neurologist again to discuss recent seizure and headaches. Discussed if any new or worsening symptoms to go to ED. - PSG SLEEP STUDY; Future  3. Essential hypertension, benign May increase hydralazine  as needed. Could increase to 25mg  TID if needed, will start with 25mg  BID with 10mg  at lunch and  monitor. Will also order PSG - PSG SLEEP STUDY; Future - hydrochlorothiazide  (HYDRODIURIL ) 25 MG tablet; Take 1 tablet (25 mg total) by mouth daily.  Dispense: 90 tablet; Refill: 1 - lisinopril  (ZESTRIL ) 40 MG tablet; Take 1 tablet by mouth once daily  Dispense: 90 tablet; Refill: 1  4. Type 2 diabetes mellitus with hyperglycemia, with long-term current use of insulin  (HCC) Continue current medications and monitoring - glimepiride  (AMARYL ) 4 MG tablet; Take 1 tablet (4 mg total) by mouth 2 (two) times daily.  Dispense: 180 tablet; Refill: 1 - metFORMIN  (GLUCOPHAGE -XR) 500 MG 24 hr tablet; Take 2 tablets (1,000 mg total) by mouth 2 (two) times daily.  Dispense: 360 tablet; Refill: 1  5. Mixed hyperlipidemia Continue crestor , refill sent - rosuvastatin  (CRESTOR ) 5 MG tablet; Take 1 tablet (5 mg total) by mouth daily.  Dispense: 90 tablet; Refill: 1  6. Obesity (BMI 30-39.9) Will order PSG and pt will continue to work on diet   General Counseling: [place] understanding of the findings of todays visit and agrees with plan of treatment. I have discussed any further diagnostic evaluation that may be needed or ordered today. We also reviewed her medications today. she has been encouraged to call the office with any questions or concerns that should arise related to todays visit.    Orders Placed This Encounter  Procedures   PSG SLEEP STUDY    Meds ordered this encounter  Medications   hydrochlorothiazide  (HYDRODIURIL ) 25 MG tablet    Sig: Take 1 tablet (25 mg total) by mouth daily.    Dispense:  90 tablet    Refill:  1   glimepiride  (AMARYL ) 4 MG tablet    Sig: Take 1 tablet (4 mg total) by mouth 2 (two) times daily.    Dispense:  180 tablet    Refill:  1   lisinopril  (ZESTRIL ) 40 MG tablet    Sig: Take 1 tablet by mouth once daily  Dispense:  90 tablet    Refill:  1   metFORMIN  (GLUCOPHAGE -XR) 500 MG 24 hr tablet    Sig: Take 2 tablets (1,000 mg total) by mouth 2 (two)  times daily.    Dispense:  360 tablet    Refill:  1   rosuvastatin  (CRESTOR ) 5 MG tablet    Sig: Take 1 tablet (5 mg total) by mouth daily.    Dispense:  90 tablet    Refill:  1    This patient was seen by Tinnie Pro, PA-C in collaboration with Dr. Sigrid Bathe as a part of collaborative care agreement.   Total time spent:35 Minutes Time spent includes review of chart, medications, test results, and follow up plan with the patient.      Dr Fozia M Khan Internal medicine

## 2023-11-16 ENCOUNTER — Other Ambulatory Visit: Payer: Self-pay | Admitting: Physician Assistant

## 2023-11-16 ENCOUNTER — Telehealth: Payer: Self-pay | Admitting: Physician Assistant

## 2023-11-16 DIAGNOSIS — F411 Generalized anxiety disorder: Secondary | ICD-10-CM

## 2023-11-16 NOTE — Telephone Encounter (Signed)
SS appointment 12/01/23 @ [facility]

## 2023-11-28 ENCOUNTER — Ambulatory Visit (INDEPENDENT_AMBULATORY_CARE_PROVIDER_SITE_OTHER): Payer: BLUE CROSS/BLUE SHIELD | Admitting: Physician Assistant

## 2023-11-28 ENCOUNTER — Encounter: Payer: Self-pay | Admitting: Physician Assistant

## 2023-11-28 VITALS — BP 142/86 | HR 85 | Temp 96.9°F | Resp 16 | Ht 67.0 in | Wt 235.6 lb

## 2023-11-28 DIAGNOSIS — G4710 Hypersomnia, unspecified: Secondary | ICD-10-CM | POA: Diagnosis not present

## 2023-11-28 DIAGNOSIS — E1165 Type 2 diabetes mellitus with hyperglycemia: Secondary | ICD-10-CM | POA: Diagnosis not present

## 2023-11-28 DIAGNOSIS — I10 Essential (primary) hypertension: Secondary | ICD-10-CM | POA: Diagnosis not present

## 2023-11-28 DIAGNOSIS — Z87898 Personal history of other specified conditions: Secondary | ICD-10-CM

## 2023-11-28 DIAGNOSIS — Z794 Long term (current) use of insulin: Secondary | ICD-10-CM

## 2023-11-28 NOTE — Progress Notes (Signed)
[facility] [address] [place], [place] [address]  Internal MEDICINE  Office Visit Note  Patient Name: Vicki Simmons  [id number]  [id number]  Date of Service: 11/28/2023  Chief Complaint  Patient presents with   Diabetes   Hypertension   Follow-up    HPI Pt is here for routine follow up -Taking hydralazine 25mg  BID and takes 10mg  at midday -BP at home in 120s systolic, doing very well now -BG improving as well -EEG done last Saturday, no results yet. Not taking venlafaxine after last time taking it seemed to of led to repeat seizure, and was given this for headaches which have now improved overall. Has appt with neurology next month -Sleep study is scheduled on Thursday -Still no energy. -takes potassium OTC and labs stable on this  Current Medication: Outpatient Encounter Medications as of 11/28/2023  Medication Sig   ALPRAZolam (XANAX) 0.5 MG tablet TAKE 1 TABLET BY MOUTH ONCE DAILY AS NEEDED FOR  MODERATE  TO  SEVERE  ANXIETY   glimepiride (AMARYL) 4 MG tablet Take 1 tablet (4 mg total) by mouth 2 (two) times daily.   hydrALAZINE (APRESOLINE) 25 MG tablet Take 1 tablet (25 mg total) by mouth 2 (two) times daily.   hydrochlorothiazide (HYDRODIURIL) 25 MG tablet Take 1 tablet (25 mg total) by mouth daily.   insulin NPH Human (NOVOLIN N) 100 UNIT/ML injection Inject 60units [place] QAM and 70 units [place] QPM   lisinopril (ZESTRIL) 40 MG tablet Take 1 tablet by mouth once daily   metFORMIN (GLUCOPHAGE-XR) 500 MG 24 hr tablet Take 2 tablets (1,000 mg total) by mouth 2 (two) times daily.   potassium chloride (KLOR-CON) 10 MEQ tablet Take 1 tablet (10 mEq total) by mouth daily.   rosuvastatin (CRESTOR) 5 MG tablet Take 1 tablet (5 mg total) by mouth daily.   [DISCONTINUED] venlafaxine XR (EFFEXOR-XR) 75 MG 24 hr capsule Take 75 mg by mouth.   No facility-administered encounter medications on file as of 11/28/2023.    Surgical History: Past Surgical History:  Procedure  Laterality Date   CESAREAN SECTION     TUBAL LIGATION      Medical History: Past Medical History:  Diagnosis Date   Diabetes (HCC)    Hypertension     Family History: Family History  Problem Relation Age of Onset   Diabetes Mother    Hyperlipidemia Mother    Hypertension Mother    Heart attack Father    Stroke Maternal Grandmother     Social History   Socioeconomic History   Marital status: Married    Spouse name: Not on file   Number of children: Not on file   Years of education: Not on file   Highest education level: Not on file  Occupational History   Not on file  Tobacco Use   Smoking status: Former    Current packs/day: 0.00    Types: Cigarettes    Quit date: 09/11/2021    Years since quitting: 2.2   Smokeless tobacco: Never   Tobacco comments:    Quit in Nov.   Vaping Use   Vaping status: Every Day  Substance and Sexual Activity   Alcohol use: No   Drug use: No   Sexual activity: Not on file  Other Topics Concern   Not on file  Social History Narrative   Not on file   Social Drivers of Health   Financial Resource Strain: Not on file  Food Insecurity: Not on file  Transportation Needs: Not on file  Physical Activity: Not on file  Stress: Not on file  Social Connections: Not on file  Intimate Partner Violence: Not on file      Review of Systems  Constitutional:  Positive for activity change and fatigue. Negative for chills and unexpected weight change.  HENT:  Negative for congestion, postnasal drip, rhinorrhea, sneezing and sore throat.   Eyes:  Negative for redness.  Respiratory:  Positive for apnea. Negative for cough, chest tightness and shortness of breath.   Cardiovascular:  Negative for chest pain and palpitations.  Gastrointestinal:  Negative for abdominal pain, constipation, diarrhea, nausea and vomiting.  Genitourinary:  Negative for dysuria and frequency.  Musculoskeletal:  Positive for arthralgias. Negative for back pain, joint  swelling and neck pain.  Skin:  Negative for rash.  Neurological: Negative.  Negative for tremors and numbness.  Hematological:  Negative for adenopathy. Does not bruise/bleed easily.  Psychiatric/Behavioral:  Positive for sleep disturbance. Negative for behavioral problems (Depression) and suicidal ideas. The patient is nervous/anxious.     Vital Signs: BP (!) 142/86 Comment: 150/86  Pulse 85   Temp (!) 96.9 F (36.1 C)   Resp 16   Ht 5\' 7"  (1.702 m)   Wt 235 lb 9.6 oz (106.9 kg)   SpO2 98%   BMI 36.90 kg/m    Physical Exam Vitals and nursing note reviewed.  Constitutional:      General: She is not in acute distress.    Appearance: Normal appearance. She is obese. She is not ill-appearing.  HENT:     Head: Normocephalic and atraumatic.  Eyes:     Pupils: Pupils are equal, round, and reactive to light.  Cardiovascular:     Rate and Rhythm: Normal rate and regular rhythm.  Pulmonary:     Effort: Pulmonary effort is normal. No respiratory distress.  Musculoskeletal:        General: Normal range of motion.  Skin:    General: Skin is warm and dry.  Neurological:     General: No focal deficit present.     Mental Status: She is alert and oriented to person, place, and time.     Gait: Gait normal.  Psychiatric:        Mood and Affect: Mood normal.        Behavior: Behavior normal.        Assessment/Plan: 1. Essential hypertension, benign (Primary) Borderline in office, but very well controlled at home now. Continue current medications  2. History of seizure Followed by neurology  3. Type 2 diabetes mellitus with hyperglycemia, with long-term current use of insulin (HCC) Continue current medications, due for A1c next visit  4. Hypersomnia Sleep study scheduled this week   General Counseling: Letitia Neri understanding of the findings of todays visit and agrees with plan of treatment. I have discussed any further diagnostic evaluation that may be needed or  ordered today. We also reviewed her medications today. she has been encouraged to call the office with any questions or concerns that should arise related to todays visit.    No orders of the defined types were placed in this encounter.   No orders of the defined types were placed in this encounter.   This patient was seen by Lynn Ito, PA-C in collaboration with Dr. Beverely Risen as a part of collaborative care agreement.   Total time spent:30 Minutes Time spent includes review of chart, medications, test results, and follow up plan with the patient.  Dr Lyndon Code Internal medicine

## 2023-12-01 ENCOUNTER — Encounter (INDEPENDENT_AMBULATORY_CARE_PROVIDER_SITE_OTHER): Payer: BLUE CROSS/BLUE SHIELD | Admitting: Internal Medicine

## 2023-12-01 DIAGNOSIS — G4733 Obstructive sleep apnea (adult) (pediatric): Secondary | ICD-10-CM

## 2023-12-05 ENCOUNTER — Telehealth: Payer: Self-pay | Admitting: Physician Assistant

## 2023-12-05 NOTE — Telephone Encounter (Signed)
 10/25/21-10/25/23 MR faxed to [facility]; [phone]

## 2023-12-06 ENCOUNTER — Telehealth: Payer: Self-pay | Admitting: Physician Assistant

## 2023-12-06 ENCOUNTER — Telehealth: Payer: Self-pay

## 2023-12-06 ENCOUNTER — Other Ambulatory Visit: Payer: Self-pay | Admitting: Physician Assistant

## 2023-12-06 DIAGNOSIS — Z87898 Personal history of other specified conditions: Secondary | ICD-10-CM

## 2023-12-06 NOTE — Telephone Encounter (Signed)
Neurology referral faxed to [facility] per patient's request; [phone]. Notified patient-Vicki Simmons

## 2023-12-07 NOTE — Procedures (Signed)
 [facility]  Polysomnogram Report Part I                                                               Phone: [phone] Fax: [phone]  Patient Name: Vicki Simmons, Vicki Simmons. Acquisition Number: [id number]  Date of Birth: 09/06/1976 Acquisition Date: 12/01/2023  Referring Physician: Tinnie Pro, PA-C     History: The patient is a 48 year old  who was referred for evaluation of . Medical History:  hypertension, diabetes, hypersomnia, history of seizure, hyperlipidemia, obesity, headaches.   Medications:  Xanax , Amaryl , Apresonline, Hydrodiuril , Novolin, Zestril , Glucophage , Klor-Con , Crestor , Effexor, Melatonin.   Procedure: This routine overnight polysomnogram was performed on the [facility] 5 using the standard diagnostic protocol. This included 6 channels of EEG, 2 channels of EOG, chin EMG, bilateral anterior tibialis EMG, nasal/oral thermistor, PTAF (nasal pressure transducer), chest and abdominal wall movements, EKG, and pulse oximetry.  Description: The total recording time was 406.5 minutes. The total sleep time was 281.0 minutes. There were a total of 125.5 minutes of wakefulness after sleep onset for a reducedsleep efficiency of 69.1%. The latency to sleep onset was short at 0.0 minutes. The R sleep onset latency wasprolonged at 176.0 minutes. Sleep parameters, as a percentage of the total sleep time, demonstrated 3.4% of sleep was in N1 sleep, 83.8% N2, 0.0% N3 and 12.8% R sleep. There were a total of 90 arousals for an arousal index of 19.2 arousals per hour of sleep that was elevated.  Respiratory monitoring demonstrated   snoring . There were 31 apneas and hypopneas for an Apnea Hypopnea Index of 6.6 apneas and hypopneas per hour of sleep. The REM related apnea hypopnea index was 25.0/hr of REM sleep compared to a NREM AHI of 3.9/hr.  The average duration of the respiratory events was 13.3 seconds with a maximum duration of 30.0 seconds. The respiratory events occurred mostly in  the supine position with an AHI of 11.1. The respiratory events were associated with peripheral oxygen desaturations on the average to 86%. The lowest oxygen desaturation associated with a respiratory event was 78%. Additionally, the baseline oxygen saturation during wakefulness was 96%, during NREM sleep averaged 95%, and during REM sleep averaged  94%. The total duration of oxygen < 90% was 4.1 minutes and <80% was 0.1 minutes.  Cardiac monitoring-  demonstrate transient cardiac decelerations associated with the apneas.  significant cardiac rhythm irregularities.   Periodic limb movement monitoring- demonstrated that there were 66 periodic limb movements for a periodic limb movement index of 14.1 periodic limb movements per hour of sleep. Quasi-periodic limb movements were observed during some periods of wakefulness.  Impression: This routine overnight polysomnogram demonstrated significant obstructive sleep apnea with an overall Apnea Hypopnea Index of 6.6 apneas and hypopneas per hour of sleep. The respiratory events were most frequent during supine, REM sleep with the lowest desaturation to 78%. As REM percentage was reduced, the findings likely underestimate the severity of the sleep apnea.  There was a slightly elevated periodic limb movement index of 14.1 periodic limb movements per hour of sleep. In addition, quasi-periodic limb movements were observed during some periods of wakefulness. Sometimes these limb movements subside once the apnea is controlled. Clinical correlation is suggested.  reduced sleep efficiency with anelevated arousal  index, a reduced REM percentage and no slow wave sleep. These findings would appear to be due to the combination of obstructive sleep apnea and  periodic limb movements.  Recommendations:    A CPAP titration would be recommended for the sleep apnea. Some supine sleep should be ensured to optimize the titration. Would recommend weight loss in a patient with a  BMI of 37.1.  Alternative treatment options may include: avoiding the supine sleep position, a nasal resistance device, an oral appliance or ENT surgery in the appropriate clinical context.     Elfreda RONAL Bathe, MD, [facility] Diplomate ABMS-Pulmonary, Critical Care and Sleep Medicine  Electronically reviewed and digitally signed   [facility] Polysomnogram Report Part II  Phone: [phone] Fax: [phone]  Patient last name [place] Neck Size 18.0 in. Acquisition [id number]  Patient first name Vicki Simmons. Weight 237.0 lbs. Started 12/01/2023 at 10:22:04 PM  Birth date 1976-07-16 Height 67.0 in. Stopped 12/02/2023 at 5:19:52 AM  Age 59 BMI 37.1 lb/in2 Duration 406.5  Study Type Adult      [place] Raford Sprang, RPSGT   Reviewed by: Marval MATSU. Henke, PhD, ABSM, FAASM Sleep Data: Lights Out: 10:30:04 PM Sleep Onset: 10:30:04 PM  Lights On: 5:16:34 AM Sleep Efficiency: 69.1 %  Total Recording Time: 406.5 min Sleep Latency (from Lights Off) 0.0 min  Total Sleep Time (TST): 281.0 min R Latency (from Sleep Onset): 176.0 min  Sleep Period Time: 401.0 min Total number of awakenings: 11  Wake during sleep: 120.0 min Wake After Sleep Onset (WASO): 125.5 min   Sleep Data:         Arousal Summary: Stage  Latency from lights out (min) Latency from sleep onset (min) Duration (min) % Total Sleep Time  Normal values  N 1 25.5 25.5 9.5 3.4 (5%)  N 2 0.0 0.0 235.5 83.8 (50%)  N 3       0.0 0.0 (20%)  R 176.0 176.0 36.0 12.8 (25%)   Number Index  Spontaneous 33 7.0  Apneas & Hypopneas 6 1.3  RERAs 0 0.0       (Apneas & Hypopneas & RERAs)  (6) (1.3)  Limb Movement 53 11.3  Snore 0 0.0  TOTAL 92 19.6     Respiratory Data:  CA OA MA Apnea Hypopnea* A+ H RERA Total  Number 2 14 0 16 15 31  0 31  Mean Dur (sec) 10.0 11.2 0.0 11.0 15.7 13.3 0.0 13.3  Max Dur (sec) 10.0 17.0 0.0 17.0 30.0 30.0 0.0 30.0  Total Dur (min) 0.3 2.6 0.0 2.9 3.9 6.9 0.0 6.9  % of TST 0.1 0.9 0.0 1.0 1.4 2.4  0.0 2.4  Index (#/h TST) 0.4 3.0 0.0 3.4 3.2 6.6 0.0 6.6  *Hypopneas scored based on 4% or greater desaturation.  Sleep Stage:        REM NREM TST  AHI 25.0 3.9 6.6  RDI 25.0 3.9 6.6           Body Position Data:  Sleep (min) TST (%) REM (min) NREM (min) CA (#) OA (#) MA (#) HYP (#) AHI (#/h) RERA (#) RDI (#/h) Desat (#)  Supine 145.5 51.78 13.0 132.5 2 12  0 13 11.1 0 11.1 7  Non-Supine 135.50 48.22 23.00 112.50 0.00 2.00 0.00 2.00 1.77 0 1.77 1.00  Right: 135.5 48.22 23.0 112.5 0 2 0 2 1.8 0 1.8 1     Snoring: Total number of snoring episodes  0  Total time with  snoring    min (   % of sleep)   Oximetry Distribution:             WK REM NREM TOTAL  Average (%)   96 94 95 95  < 90% 0.0 2.3 1.8 4.1  < 80% 0.0 0.1 0.0 0.1  < 70% 0.0 0.0 0.0 0.0  # of Desaturations* 0 0 5 5  Desat Index (#/hour) 0.0 0.0 1.2 1.1  Desat Max (%) 0 0 8 8  Desat Max Dur (sec) 0.0 0.0 33.0 33.0  Approx Min O2 during sleep 78  Approx min O2 during a respiratory event 78  Was Oxygen added (Y/N) and final rate :    LPM  *Desaturations based on 4% or greater drop from baseline.   Cheyne Stokes Breathing: None Present   Heart Rate Summary:  Average Heart Rate During Sleep 71.0 bpm      Highest Heart Rate During Sleep (95th %) 79.0 bpm      Highest Heart Rate During Sleep 91 bpm      Highest Heart Rate During Recording (TIB) 149 bpm       Heart Rate Observations: Event Type # Events   Bradycardia 0 Lowest HR Scored: N/A  Sinus Tachycardia During Sleep 0 Highest HR Scored: N/A  Narrow Complex Tachycardia 0 Highest HR Scored: N/A  Wide Complex Tachycardia 0 Highest HR Scored: N/A  Asystole 0 Longest Pause: N/A  Atrial Fibrillation 0 Duration Longest Event: N/A  Other Arrythmias   Type:    Periodic Limb Movement Data: (Primary legs unless otherwise noted) Total # Limb Movement 74 Limb Movement Index 15.8  Total # PLMS 66 PLMS Index 14.1  Total # PLMS Arousals 45 PLMS Arousal  Index 9.6  Percentage Sleep Time with PLMS 46.69min (16.7 % sleep)  Mean Duration limb movements (secs) 702.3

## 2023-12-13 ENCOUNTER — Telehealth: Payer: Self-pay | Admitting: Physician Assistant

## 2023-12-13 NOTE — Telephone Encounter (Signed)
Lvm & sent mychart msg to change 12/15/23 appointment to virtual-Toni

## 2023-12-14 NOTE — Telephone Encounter (Signed)
 done

## 2023-12-15 ENCOUNTER — Other Ambulatory Visit: Payer: Self-pay | Admitting: Physician Assistant

## 2023-12-15 ENCOUNTER — Ambulatory Visit: Payer: BLUE CROSS/BLUE SHIELD | Admitting: Physician Assistant

## 2023-12-15 DIAGNOSIS — F411 Generalized anxiety disorder: Secondary | ICD-10-CM

## 2023-12-22 ENCOUNTER — Ambulatory Visit: Payer: BLUE CROSS/BLUE SHIELD | Admitting: Physician Assistant

## 2023-12-22 ENCOUNTER — Encounter: Payer: Self-pay | Admitting: Physician Assistant

## 2023-12-22 VITALS — BP 133/80 | HR 97 | Temp 98.3°F | Resp 16 | Ht 67.0 in | Wt 234.8 lb

## 2023-12-22 DIAGNOSIS — E1165 Type 2 diabetes mellitus with hyperglycemia: Secondary | ICD-10-CM | POA: Diagnosis not present

## 2023-12-22 DIAGNOSIS — E782 Mixed hyperlipidemia: Secondary | ICD-10-CM | POA: Diagnosis not present

## 2023-12-22 DIAGNOSIS — Z794 Long term (current) use of insulin: Secondary | ICD-10-CM

## 2023-12-22 DIAGNOSIS — Z87898 Personal history of other specified conditions: Secondary | ICD-10-CM

## 2023-12-22 DIAGNOSIS — G4733 Obstructive sleep apnea (adult) (pediatric): Secondary | ICD-10-CM | POA: Diagnosis not present

## 2023-12-22 DIAGNOSIS — I10 Essential (primary) hypertension: Secondary | ICD-10-CM | POA: Diagnosis not present

## 2023-12-22 MED ORDER — HYDRALAZINE HCL 25 MG PO TABS: 25.0000 mg | ORAL_TABLET | Freq: Two times a day (BID) | ORAL | 2 refills | Status: DC

## 2023-12-22 MED ORDER — METFORMIN HCL 1000 MG PO TABS: 1000.0000 mg | ORAL_TABLET | Freq: Two times a day (BID) | ORAL | 3 refills | Status: DC

## 2023-12-22 MED ORDER — GLIMEPIRIDE 4 MG PO TABS
4.0000 mg | ORAL_TABLET | Freq: Two times a day (BID) | ORAL | 1 refills | Status: DC
Start: 2023-12-22 — End: 2024-06-04

## 2023-12-22 MED ORDER — HYDRALAZINE HCL 10 MG PO TABS: 10.0000 mg | ORAL_TABLET | Freq: Every day | ORAL | 0 refills | Status: DC | PRN

## 2023-12-22 MED ORDER — METFORMIN HCL ER 500 MG PO TB24: 1000.0000 mg | ORAL_TABLET | Freq: Two times a day (BID) | ORAL | 1 refills | Status: DC

## 2023-12-22 MED ORDER — ROSUVASTATIN CALCIUM 5 MG PO TABS
5.0000 mg | ORAL_TABLET | Freq: Every day | ORAL | 1 refills | Status: DC
Start: 2023-12-22 — End: 2024-06-04

## 2023-12-22 NOTE — Progress Notes (Signed)
 [facility] [address] [place], [place] [address]  Internal MEDICINE  Office Visit Note  Patient Name: Vicki Simmons  [id number]  [id number]  Date of Service: 12/29/2023  Chief Complaint  Patient presents with   Follow-up    Review sleep study   Diabetes   Hypertension   Medication Refill    Hydralazine (both doses), Rosuvastatin, Lisinopril, Glimepiride, Metformin    HPI Pt is here for routine follow up -Reviewed sleep study--does show mild OSA with AHI of 6.6 and desat as low as 78%. REM was reduced and may underrepresent the severity of the apnea. She is scheduled for titration already and is going to move forward with this. Did discuss possible alternative like oral appliance, but recommend trying cpap and she is open to this. -still exhausted -Needs [facility] neurology---Dr. Elease Hashimoto [place], has had 3 seizures now -states previous neurologist wanted her to try nortriptyline when she called about having a seizure after taking effexor 1 time and was told to increase dose. When she declined she states they offered nortriptyline as an alternative. Upon researching what this is used for she decided not to take it as she is not having depression or headaches really anymore and saw where it can increase risk of seizure. She really is just concerned about the seizures as she has now had 3 total. She would like a new referral now. Had tried to see [facility], but they did not take her insurance -medication refills done -BP stable, she is taking 25mg  hydralazine BID with the 10mg  at lunch and seems to work well  Current Medication: Outpatient Encounter Medications as of 12/22/2023  Medication Sig   ALPRAZolam (XANAX) 0.5 MG tablet TAKE 1 TABLET BY MOUTH ONCE DAILY AS NEEDED FOR  MODERATE  TO  SEVERE  ANXIETY   hydrALAZINE (APRESOLINE) 10 MG tablet Take 1 tablet (10 mg total) by mouth daily as needed.   hydrochlorothiazide (HYDRODIURIL) 25 MG tablet Take 1 tablet  (25 mg total) by mouth daily.   insulin NPH Human (NOVOLIN N) 100 UNIT/ML injection Inject 60units [place] QAM and 70 units [place] QPM   lisinopril (ZESTRIL) 40 MG tablet Take 1 tablet by mouth once daily   metFORMIN (GLUCOPHAGE) 1000 MG tablet Take 1 tablet (1,000 mg total) by mouth 2 (two) times daily with a meal.   potassium chloride (KLOR-CON) 10 MEQ tablet Take 1 tablet (10 mEq total) by mouth daily.   [DISCONTINUED] glimepiride (AMARYL) 4 MG tablet Take 1 tablet (4 mg total) by mouth 2 (two) times daily.   [DISCONTINUED] hydrALAZINE (APRESOLINE) 25 MG tablet Take 1 tablet (25 mg total) by mouth 2 (two) times daily.   [DISCONTINUED] metFORMIN (GLUCOPHAGE-XR) 500 MG 24 hr tablet Take 2 tablets (1,000 mg total) by mouth 2 (two) times daily.   [DISCONTINUED] rosuvastatin (CRESTOR) 5 MG tablet Take 1 tablet (5 mg total) by mouth daily.   glimepiride (AMARYL) 4 MG tablet Take 1 tablet (4 mg total) by mouth 2 (two) times daily.   hydrALAZINE (APRESOLINE) 25 MG tablet Take 1 tablet (25 mg total) by mouth 2 (two) times daily.   rosuvastatin (CRESTOR) 5 MG tablet Take 1 tablet (5 mg total) by mouth daily.   [DISCONTINUED] metFORMIN (GLUCOPHAGE-XR) 500 MG 24 hr tablet Take 2 tablets (1,000 mg total) by mouth 2 (two) times daily.   No facility-administered encounter medications on file as of 12/22/2023.    Surgical History: Past Surgical History:  Procedure Laterality Date   CESAREAN  SECTION     TUBAL LIGATION      Medical History: Past Medical History:  Diagnosis Date   Diabetes (HCC)    Hypertension     Family History: Family History  Problem Relation Age of Onset   Diabetes Mother    Hyperlipidemia Mother    Hypertension Mother    Heart attack Father    Stroke Maternal Grandmother     Social History   Socioeconomic History   Marital status: Married    Spouse name: Not on file   Number of children: Not on file   Years of education: Not on file   Highest education level: Not on file   Occupational History   Not on file  Tobacco Use   Smoking status: Former    Current packs/day: 0.00    Types: Cigarettes    Quit date: 09/11/2021    Years since quitting: 2.2   Smokeless tobacco: Never   Tobacco comments:    Quit in Nov.   Vaping Use   Vaping status: Every Day  Substance and Sexual Activity   Alcohol use: No   Drug use: No   Sexual activity: Not on file  Other Topics Concern   Not on file  Social History Narrative   Not on file   Social Drivers of Health   Financial Resource Strain: Not on file  Food Insecurity: Not on file  Transportation Needs: Not on file  Physical Activity: Not on file  Stress: Not on file  Social Connections: Not on file  Intimate Partner Violence: Not on file      Review of Systems  Constitutional:  Positive for activity change and fatigue. Negative for chills and unexpected weight change.  HENT:  Negative for congestion, postnasal drip, rhinorrhea, sneezing and sore throat.   Eyes:  Negative for redness.  Respiratory:  Positive for apnea. Negative for cough, chest tightness and shortness of breath.   Cardiovascular:  Negative for chest pain and palpitations.  Gastrointestinal:  Negative for abdominal pain, constipation, diarrhea, nausea and vomiting.  Genitourinary:  Negative for dysuria and frequency.  Musculoskeletal:  Negative for back pain, joint swelling and neck pain.  Skin:  Negative for rash.  Neurological:  Positive for seizures. Negative for tremors and numbness.  Hematological:  Negative for adenopathy. Does not bruise/bleed easily.  Psychiatric/Behavioral:  Positive for sleep disturbance. Negative for behavioral problems (Depression) and suicidal ideas. The patient is nervous/anxious.     Vital Signs: BP 133/80   Pulse 97   Temp 98.3 F (36.8 C)   Resp 16   Ht 5\' 7"  (1.702 m)   Wt 234 lb 12.8 oz (106.5 kg)   SpO2 97%   BMI 36.77 kg/m    Physical Exam Vitals and nursing note reviewed.   Constitutional:      General: She is not in acute distress.    Appearance: Normal appearance. She is obese. She is not ill-appearing.  HENT:     Head: Normocephalic and atraumatic.  Eyes:     Pupils: Pupils are equal, round, and reactive to light.  Cardiovascular:     Rate and Rhythm: Normal rate and regular rhythm.  Pulmonary:     Effort: Pulmonary effort is normal. No respiratory distress.  Musculoskeletal:        General: Normal range of motion.  Skin:    General: Skin is warm and dry.  Neurological:     General: No focal deficit present.     Mental Status: She  is alert and oriented to person, place, and time.     Gait: Gait normal.  Psychiatric:        Mood and Affect: Mood normal.        Behavior: Behavior normal.        Assessment/Plan: 1. History of seizure (Primary) Will send new neurology referral - Ambulatory referral to Neurology  2. Type 2 diabetes mellitus with hyperglycemia, with long-term current use of insulin (HCC) - glimepiride (AMARYL) 4 MG tablet; Take 1 tablet (4 mg total) by mouth 2 (two) times daily.  Dispense: 180 tablet; Refill: 1 - metFORMIN (GLUCOPHAGE) 1000 MG tablet; Take 1 tablet (1,000 mg total) by mouth 2 (two) times daily with a meal.  Dispense: 180 tablet; Refill: 3  3. Essential hypertension, benign May continue with 25mg  hydralazine BID and 10mg  at lunch since working well - hydrALAZINE (APRESOLINE) 25 MG tablet; Take 1 tablet (25 mg total) by mouth 2 (two) times daily.  Dispense: 60 tablet; Refill: 2  4. OSA (obstructive sleep apnea) Has titration study scheduled  5. Mixed hyperlipidemia - rosuvastatin (CRESTOR) 5 MG tablet; Take 1 tablet (5 mg total) by mouth daily.  Dispense: 90 tablet; Refill: 1   General Counseling: Chalonda verbalizes understanding of the findings of todays visit and agrees with plan of treatment. I have discussed any further diagnostic evaluation that may be needed or ordered today. We also reviewed her  medications today. she has been encouraged to call the office with any questions or concerns that should arise related to todays visit.    Orders Placed This Encounter  Procedures   Ambulatory referral to Neurology    Meds ordered this encounter  Medications   glimepiride (AMARYL) 4 MG tablet    Sig: Take 1 tablet (4 mg total) by mouth 2 (two) times daily.    Dispense:  180 tablet    Refill:  1   DISCONTD: metFORMIN (GLUCOPHAGE-XR) 500 MG 24 hr tablet    Sig: Take 2 tablets (1,000 mg total) by mouth 2 (two) times daily.    Dispense:  360 tablet    Refill:  1   rosuvastatin (CRESTOR) 5 MG tablet    Sig: Take 1 tablet (5 mg total) by mouth daily.    Dispense:  90 tablet    Refill:  1   hydrALAZINE (APRESOLINE) 25 MG tablet    Sig: Take 1 tablet (25 mg total) by mouth 2 (two) times daily.    Dispense:  60 tablet    Refill:  2   hydrALAZINE (APRESOLINE) 10 MG tablet    Sig: Take 1 tablet (10 mg total) by mouth daily as needed.    Dispense:  30 tablet    Refill:  0   metFORMIN (GLUCOPHAGE) 1000 MG tablet    Sig: Take 1 tablet (1,000 mg total) by mouth 2 (two) times daily with a meal.    Dispense:  180 tablet    Refill:  3    Please see dose change and fill 1000mg  not 500mg  dose    This patient was seen by Lynn Ito, PA-C in collaboration with Dr. Beverely Risen as a part of collaborative care agreement.   Total time spent:30 Minutes Time spent includes review of chart, medications, test results, and follow up plan with the patient.      Dr Lyndon Code Internal medicine

## 2023-12-23 ENCOUNTER — Telehealth: Payer: Self-pay | Admitting: Physician Assistant

## 2023-12-23 NOTE — Telephone Encounter (Signed)
 Awaiting 12/22/23 office notes for neurology referral-Toni

## 2023-12-29 ENCOUNTER — Telehealth: Payer: Self-pay | Admitting: Physician Assistant

## 2023-12-29 NOTE — Telephone Encounter (Signed)
 Urgent Neurology referral faxed to Dr. Aldean Baker w/ [facility] ; [phone] Notified patient. Gave pt telephone [phone]

## 2024-01-27 ENCOUNTER — Encounter (INDEPENDENT_AMBULATORY_CARE_PROVIDER_SITE_OTHER): Payer: Self-pay | Admitting: Internal Medicine

## 2024-01-27 DIAGNOSIS — G4733 Obstructive sleep apnea (adult) (pediatric): Secondary | ICD-10-CM

## 2024-01-30 ENCOUNTER — Other Ambulatory Visit: Payer: Self-pay | Admitting: Physician Assistant

## 2024-02-02 ENCOUNTER — Encounter: Payer: Self-pay | Admitting: Physician Assistant

## 2024-02-02 ENCOUNTER — Ambulatory Visit (INDEPENDENT_AMBULATORY_CARE_PROVIDER_SITE_OTHER): Payer: BLUE CROSS/BLUE SHIELD | Admitting: Physician Assistant

## 2024-02-02 VITALS — BP 130/67 | HR 97 | Temp 98.2°F | Resp 16 | Ht 67.0 in | Wt 236.0 lb

## 2024-02-02 DIAGNOSIS — G4733 Obstructive sleep apnea (adult) (pediatric): Secondary | ICD-10-CM

## 2024-02-02 DIAGNOSIS — Z794 Long term (current) use of insulin: Secondary | ICD-10-CM | POA: Diagnosis not present

## 2024-02-02 DIAGNOSIS — Z87898 Personal history of other specified conditions: Secondary | ICD-10-CM

## 2024-02-02 DIAGNOSIS — I10 Essential (primary) hypertension: Secondary | ICD-10-CM | POA: Diagnosis not present

## 2024-02-02 DIAGNOSIS — E1165 Type 2 diabetes mellitus with hyperglycemia: Secondary | ICD-10-CM | POA: Diagnosis not present

## 2024-02-02 LAB — POCT GLYCOSYLATED HEMOGLOBIN (HGB A1C): Hemoglobin A1C: 6.4 % — AB (ref 4.0–5.6)

## 2024-02-02 NOTE — Progress Notes (Signed)
 [facility] [address] [place], [place] [address]  Internal MEDICINE  Office Visit Note  Patient Name: Vicki Simmons  [id number]  [id number]  Date of Service: 02/02/2024  Chief Complaint  Patient presents with   Follow-up   Hypertension   Diabetes    HPI Pt is here for routine follow up -Seeing [facility] neurology now, saw them on Tuesday for initial consult. States this went well and provider was very thorough -Will be getting MRI next month, has another EEG next Friday. In 3 months will also see the epilepsy provider. -Did see Dr. Malvin Johns once more while waiting for the [facility] visit and was put on seizure medication and was told to continue by new provider. This is lamictal and is titrating up. -Went back to work yesterday. Energy doing a little better now. -Cpap titration done last week. Did ok with the nasal mask. Results not available yet, but ready to move forward with setup. -BG much better, using monk fruit as sugar supplement. Has cut back in general -BP stable -put on trazodone to help sleep as well. Taking 2 tabs but not a big difference. Has not been taking xanax recently though and feels she is doing ok without this. Discussed sleep may also improve now that everything else is being sorted out and once CPAP starts  Current Medication: Outpatient Encounter Medications as of 02/02/2024  Medication Sig   ALPRAZolam (XANAX) 0.5 MG tablet TAKE 1 TABLET BY MOUTH ONCE DAILY AS NEEDED FOR  MODERATE  TO  SEVERE  ANXIETY   glimepiride (AMARYL) 4 MG tablet Take 1 tablet (4 mg total) by mouth 2 (two) times daily.   hydrALAZINE (APRESOLINE) 10 MG tablet TAKE 1 TABLET BY MOUTH ONCE DAILY AS NEEDED   hydrALAZINE (APRESOLINE) 25 MG tablet Take 1 tablet (25 mg total) by mouth 2 (two) times daily.   hydrochlorothiazide (HYDRODIURIL) 25 MG tablet Take 1 tablet (25 mg total) by mouth daily.   insulin NPH Human (NOVOLIN N) 100 UNIT/ML injection Inject 60units [place] QAM and 70 units [place]  QPM   lamoTRIgine (LAMICTAL) 25 MG tablet Take 25 mg by mouth daily.   lisinopril (ZESTRIL) 40 MG tablet Take 1 tablet by mouth once daily   metFORMIN (GLUCOPHAGE) 1000 MG tablet Take 1 tablet (1,000 mg total) by mouth 2 (two) times daily with a meal.   potassium chloride (KLOR-CON) 10 MEQ tablet Take 1 tablet (10 mEq total) by mouth daily.   rosuvastatin (CRESTOR) 5 MG tablet Take 1 tablet (5 mg total) by mouth daily.   traZODone (DESYREL) 50 MG tablet Take 50 mg by mouth at bedtime.   No facility-administered encounter medications on file as of 02/02/2024.    Surgical History: Past Surgical History:  Procedure Laterality Date   CESAREAN SECTION     TUBAL LIGATION      Medical History: Past Medical History:  Diagnosis Date   Diabetes (HCC)    Hypertension     Family History: Family History  Problem Relation Age of Onset   Diabetes Mother    Hyperlipidemia Mother    Hypertension Mother    Heart attack Father    Stroke Maternal Grandmother     Social History   Socioeconomic History   Marital status: Married    Spouse name: Not on file   Number of children: Not on file   Years of education: Not on file   Highest education level: Not on file  Occupational History   Not on  file  Tobacco Use   Smoking status: Former    Current packs/day: 0.00    Types: Cigarettes    Quit date: 09/11/2021    Years since quitting: 2.3   Smokeless tobacco: Never   Tobacco comments:    Quit in Nov.   Vaping Use   Vaping status: Every Day  Substance and Sexual Activity   Alcohol use: No   Drug use: No   Sexual activity: Not on file  Other Topics Concern   Not on file  Social History Narrative   Not on file   Social Drivers of Health   Financial Resource Strain: Not on file  Food Insecurity: Not on file  Transportation Needs: Not on file  Physical Activity: Not on file  Stress: Not on file  Social Connections: Not on file  Intimate Partner Violence: Not on file       Review of Systems  Constitutional:  Negative for chills and unexpected weight change.  HENT:  Negative for congestion, postnasal drip, rhinorrhea, sneezing and sore throat.   Eyes:  Negative for redness.  Respiratory:  Positive for apnea. Negative for cough, chest tightness and shortness of breath.   Cardiovascular:  Negative for chest pain and palpitations.  Gastrointestinal:  Negative for abdominal pain, constipation, diarrhea, nausea and vomiting.  Genitourinary:  Negative for dysuria and frequency.  Musculoskeletal:  Negative for back pain, joint swelling and neck pain.  Skin:  Negative for rash.  Neurological:  Negative for tremors and numbness.  Hematological:  Negative for adenopathy. Does not bruise/bleed easily.  Psychiatric/Behavioral:  Positive for sleep disturbance. Negative for behavioral problems (Depression) and suicidal ideas. The patient is nervous/anxious.     Vital Signs: BP 130/67   Pulse 97   Temp 98.2 F (36.8 C)   Resp 16   Ht 5\' 7"  (1.702 m)   Wt 236 lb (107 kg)   SpO2 98%   BMI 36.96 kg/m    Physical Exam Vitals and nursing note reviewed.  Constitutional:      General: She is not in acute distress.    Appearance: Normal appearance. She is obese. She is not ill-appearing.  HENT:     Head: Normocephalic and atraumatic.  Cardiovascular:     Rate and Rhythm: Normal rate and regular rhythm.  Pulmonary:     Effort: Pulmonary effort is normal. No respiratory distress.  Musculoskeletal:        General: Normal range of motion.  Skin:    General: Skin is warm and dry.  Neurological:     General: No focal deficit present.     Mental Status: She is alert and oriented to person, place, and time.     Gait: Gait normal.  Psychiatric:        Mood and Affect: Mood normal.        Behavior: Behavior normal.        Assessment/Plan: 1. Essential hypertension, benign (Primary) Stable, continue current medications  2. Type 2 diabetes mellitus  with hyperglycemia, with long-term current use of insulin (HCC) - POCT HgB A1C is 6.4 which is greatly improved from 7.4 last visit. Very happy with this improvement and will continue with current medications and diet  3. History of seizure Followed by neurology  4. OSA (obstructive sleep apnea) Awaiting CPAP titration results then will place CPAP order based on this.   General Counseling: tasia liz understanding of the findings of todays visit and agrees with plan of treatment. I have discussed any  further diagnostic evaluation that may be needed or ordered today. We also reviewed her medications today. she has been encouraged to call the office with any questions or concerns that should arise related to todays visit.    Orders Placed This Encounter  Procedures   POCT HgB A1C    No orders of the defined types were placed in this encounter.   This patient was seen by Lynn Ito, PA-C in collaboration with Dr. Beverely Risen as a part of collaborative care agreement.   Total time spent:30 Minutes Time spent includes review of chart, medications, test results, and follow up plan with the patient.      Dr Lyndon Code Internal medicine

## 2024-02-03 ENCOUNTER — Telehealth: Payer: Self-pay | Admitting: Physician Assistant

## 2024-02-03 NOTE — Telephone Encounter (Signed)
 Cpap order signed. Faxed back to [facility]; [phone]. Scanned-Toni

## 2024-02-09 NOTE — Procedures (Signed)
 [facility]  Polysomnogram Report Part I  Phone: [phone] Fax: [phone]  Patient Name: Vicki Simmons, Vicki Simmons. Acquisition Number: [id number]  Date of Birth: [birth year] Acquisition Date: 01/27/2024  Referring Physician: Taylor Favia PA-C     History: The patient is a 48 year old  . Medical History: hypertension, diabetes, hypersomnia, history of seizure, hyperlipidemia, obesity, headaches.  Medications:  Xanax, Amaryl, Apresonline, Hydrodiuril, Novolin, Zestril, Glucophage, Klor-Con, Crestor, Effexor, Trazadone.  Procedure: This routine overnight polysomnogram was performed on the [facility] 5 using the standard CPAP protocol. This included 6 channels of EEG, 2 channels of EOG, chin EMG, bilateral anterior tibialis EMG, nasal/oral thermistor, PTAF (nasal pressure transducer), chest and abdominal wall movements, EKG, and pulse oximetry.  Description: The total recording time was 342.5 minutes. The total sleep time was 262.0 minutes. There were a total of 71.0 minutes of wakefulness after sleep onset for areducedsleep efficiency of 76.5%. The latency to sleep onset was shortat 9.5 minutes. The R sleep onset latency waspr olonged at 199.5 minutes. Sleep parameters, as a percentage of the total sleep time, demonstrated 4.0% of sleep was in N1 sleep, 79.8% N2, 0.0% N3 and 16.2% R sleep. There were a total of 16 arousals for an arousal index of 3.7 arousals per hour of sleep that was normal.  Overall, there were a total of 7 respiratory events for a respiratory disturbance index, which includes apneas, hypopneas and RERAs (increased respiratory effort) of 1.6 respiratory events per hour of sleep during the pressure titration.  was initiated at 4 cm H2O at lights out, 11:43 p.m. It was titrated in 1-2 cm increments largely for snoring and a few hypopneas to the final pressure of 7 cm H2O. The apnea was controlled at this pressure and supine, REM sleep was observed.   Additionally, the baseline  oxygen saturation during wakefulness was 96%, during NREM sleep averaged 95%, and during REM sleep averaged 95%. The total duration of oxygen < 90% was 0.3 minutes.  Cardiac monitoring-  significant cardiac rhythm irregularities.   Periodic limb movement monitoring- demonstrated that there were 10 periodic limb movements for a periodic limb movement index of 2.3 periodic limb movements per hour of sleep.     Impression: This patient's obstructive sleep apnea demonstrated significant improvement with the utilization of nasal  at 7 cm H2O.   There were few periodic limb movements that commonly are not significant. Clinical correlation would be suggested.   Recommendations: Would recommend utilization of nasal  at 7 cm H2O.      An N20 mask, size medium, was used. Chin strap used during study- no. Humidifier used during study- yes.     Cordie Deters, MD, [facility] Diplomate ABMS-Pulmonary, Critical Care and Sleep Medicine  Electronically reviewed and digitally signed  [facility] CPAP/BIPAP Polysomnogram Report Part II Phone: [phone] Fax: [phone]  Patient last name Oo Neck Size 18.0 in. Acquisition [phone]  Patient first name Vicki Simmons. Weight 237.0 lbs. Started 01/27/2024 at 11:34:05 PM  Birth date [birth year] Height 67.0 in. Stopped 01/28/2024 at 5:29:35 AM  Age 48      Type Adult BMI 37.1 lb/in2 Duration 342.5  [place] Blaze, RPSGT & Ja'Net Carlon [place]   Reviewed by: Delmus Ferri. Henke, PhD, ABSM, FAASM Sleep Data: Lights Out: 11:43:35 PM Sleep Onset: 11:53:05 PM  Lights On: 5:26:05 AM Sleep Efficiency: 76.5 %  Total Recording Time: 342.5 min Sleep Latency (from Lights Off) 9.5 min  Total Sleep Time (TST): 262.0 min R  Latency (from Sleep Onset): 199.5 min  Sleep Period Time: 330.5 min Total number of awakenings: 8  Wake during sleep: 68.5 min Wake After Sleep Onset (WASO): 71.0 min   Sleep Data:         Arousal Summary: Stage  Latency from lights out (min) Latency  from sleep onset (min) Duration (min) % Total Sleep Time  Normal values  N 1 9.5 0.0 10.5 4.0 (5%)  N 2 11.0 1.5 209.0 79.8 (50%)  N 3       0.0 0.0 (20%)  R 209.0 199.5 42.5 16.2 (25%)   Number Index  Spontaneous 3 0.7  Apneas & Hypopneas 1 0.2  RERAs 0 0.0       (Apneas & Hypopneas & RERAs)  (1) (0.2)  Limb Movement 12 2.7  Snore 0 0.0  TOTAL 16 3.7     Respiratory Data:  CA OA MA Apnea Hypopnea* A+ H RERA Total  Number 0 1 0 1 6 7  0 7  Mean Dur (sec) 0.0 13.0 0.0 13.0 12.0 12.1 0.0 12.1  Max Dur (sec) 0.0 13.0 0.0 13.0 16.5 16.5 0.0 16.5  Total Dur (min) 0.0 0.2 0.0 0.2 1.2 1.4 0.0 1.4  % of TST 0.0 0.1 0.0 0.1 0.5 0.5 0.0 0.5  Index (#/h TST) 0.0 0.2 0.0 0.2 1.4 1.6 0.0 1.6  *Hypopneas scored based on 4% or greater desaturation.  Sleep Stage:         REM NREM TST  AHI 0.0 1.9 1.6  RDI 0.0 1.9 1.6   Sleep (min) TST (%) REM (min) NREM (min) CA (#) OA (#) MA (#) HYP (#) AHI (#/h) RERA (#) RDI (#/h) Desat (#)  Supine 204.0 77.86 16.0 188.0 0 1 0 6 2.1 0 2.1 24  Non-Supine 58.00 22.14 26.50 31.50 0.00 0.00 0.00 0.00 0.00 0 0.00 1.00  Right: 58.0 22.14 26.5 31.5 0 0 0 0 0.0 0 0.00 1     Snoring: Total number of snoring episodes  0  Total time with snoring    min (   % of sleep)   Oximetry Distribution:             WK REM NREM TOTAL  Average (%)   96 95 95 95  < 90% 0.3 0.0 0.0 0.3  < 80% 0.3 0.0 0.0 0.3  < 70% 0.3 0.0 0.0 0.3  # of Desaturations* 1 8 16 25   Desat Index (#/hour) 0.8 11.3 4.4 5.7  Desat Max (%) 3 7 7 7   Desat Max Dur (sec) 17.0 103.0 108.0 108.0  Approx Min O2 during sleep 88  Approx min O2 during a respiratory event 90  Was Oxygen added (Y/N) and final rate :    LPM  *Desaturations based on 3% or greater drop from baseline.   Cheyne Stokes Breathing: None Present   Heart Rate Summary:  Average Heart Rate During Sleep 66.3 bpm      Highest Heart Rate During Sleep (95th %) 74.0 bpm      Highest Heart Rate During Sleep 90 bpm       Highest Heart Rate During Recording (TIB) 188 bpm (artifact)   Heart Rate Observations: Event Type # Events   Bradycardia 0 Lowest HR Scored: N/A  Sinus Tachycardia During Sleep 0 Highest HR Scored: N/A  Narrow Complex Tachycardia 0 Highest HR Scored: N/A  Wide Complex Tachycardia 0 Highest HR Scored: N/A  Asystole 0 Longest Pause: N/A  Atrial Fibrillation 0 Duration Longest  Event: N/A  Other Arrythmias   Type:   Periodic Limb Movement Data: (Primary legs unless otherwise noted) Total # Limb Movement 17 Limb Movement Index 3.9  Total # PLMS 10 PLMS Index 2.3  Total # PLMS Arousals 7 PLMS Arousal Index 1.6  Percentage Sleep Time with PLMS 4.76min (1.5 % sleep)  Mean Duration limb movements (secs) 121.5    IPAP Level (cmH2O) EPAP Level (cmH2O) Total Duration (min) Sleep Duration (min) Sleep (%) REM (%) CA  #) OA # MA # HYP #) AHI (#/hr) RERAs # RERAs (#/hr) RDI (#/hr)  4 4 107.0 102.5 95.8 0.0 0 1 0 5 3.5 0 0.0 3.5  5 5  11.6 5.8 50.0 0.0 0 0 0 1 10.3 0 0.0 10.3  6 6  140.3 92.3 65.8 18.9 0 0 0 0 0.0 0 0.0 0.0  7 7 60.9 60.4 99.2 26.3 0 0 0 0 0.0 0 0.0 0.0

## 2024-03-08 ENCOUNTER — Encounter: Payer: Self-pay | Admitting: Physician Assistant

## 2024-03-08 ENCOUNTER — Ambulatory Visit (INDEPENDENT_AMBULATORY_CARE_PROVIDER_SITE_OTHER): Admitting: Physician Assistant

## 2024-03-08 VITALS — BP 138/80 | HR 73 | Temp 97.8°F | Resp 16 | Ht 67.0 in | Wt 237.0 lb

## 2024-03-08 DIAGNOSIS — G4733 Obstructive sleep apnea (adult) (pediatric): Secondary | ICD-10-CM | POA: Diagnosis not present

## 2024-03-08 DIAGNOSIS — E1165 Type 2 diabetes mellitus with hyperglycemia: Secondary | ICD-10-CM

## 2024-03-08 DIAGNOSIS — G4700 Insomnia, unspecified: Secondary | ICD-10-CM

## 2024-03-08 DIAGNOSIS — F411 Generalized anxiety disorder: Secondary | ICD-10-CM | POA: Diagnosis not present

## 2024-03-08 DIAGNOSIS — I10 Essential (primary) hypertension: Secondary | ICD-10-CM

## 2024-03-08 DIAGNOSIS — Z794 Long term (current) use of insulin: Secondary | ICD-10-CM

## 2024-03-08 MED ORDER — ALPRAZOLAM 0.25 MG PO TABS: 0.2500 mg | ORAL_TABLET | Freq: Two times a day (BID) | ORAL | 1 refills | Status: DC | PRN

## 2024-03-08 NOTE — Progress Notes (Signed)
 [facility] [address] [place], [place] [address]  Internal MEDICINE  Office Visit Note  Patient Name: Vicki Simmons  [id number]  [id number]  Date of Service: 03/26/2024  Chief Complaint  Patient presents with   Follow-up    HPI Pt is here for routine follow up -Started cpap 1 week ago, tolerating ok -Seeing neurology for seizures, on lamictal 100mg  BID now, has been titrating. Has noticed more short term memory concerns since seizures started. Will discuss with neurology. Has MRI next week. -still some trouble getting to sleep and some anxiety, will refill xanax  and may use 1/2 tab as needed. Trazodone has not helped and did increase dose -BP has been ok at home, sugars have been really well controlled, 86-120s -walks at work, but otherwise no exercise  Current Medication: Outpatient Encounter Medications as of 03/08/2024  Medication Sig   ALPRAZolam  (XANAX ) 0.25 MG tablet Take 1 tablet (0.25 mg total) by mouth 2 (two) times daily as needed for anxiety.   glimepiride  (AMARYL ) 4 MG tablet Take 1 tablet (4 mg total) by mouth 2 (two) times daily.   hydrALAZINE  (APRESOLINE ) 10 MG tablet TAKE 1 TABLET BY MOUTH ONCE DAILY AS NEEDED   hydrALAZINE  (APRESOLINE ) 25 MG tablet Take 1 tablet (25 mg total) by mouth 2 (two) times daily.   hydrochlorothiazide  (HYDRODIURIL ) 25 MG tablet Take 1 tablet (25 mg total) by mouth daily.   insulin  NPH Human (NOVOLIN N) 100 UNIT/ML injection Inject 60units [place] QAM and 70 units [place] QPM   lamoTRIgine (LAMICTAL) 25 MG tablet Take 25 mg by mouth daily.   lisinopril  (ZESTRIL ) 40 MG tablet Take 1 tablet by mouth once daily   metFORMIN  (GLUCOPHAGE ) 1000 MG tablet Take 1 tablet (1,000 mg total) by mouth 2 (two) times daily with a meal.   potassium chloride  (KLOR-CON ) 10 MEQ tablet Take 1 tablet (10 mEq total) by mouth daily.   rosuvastatin  (CRESTOR ) 5 MG tablet Take 1 tablet (5 mg total) by mouth daily.   traZODone (DESYREL) 50 MG tablet Take 50 mg  by mouth at bedtime.   [DISCONTINUED] ALPRAZolam  (XANAX ) 0.5 MG tablet TAKE 1 TABLET BY MOUTH ONCE DAILY AS NEEDED FOR  MODERATE  TO  SEVERE  ANXIETY   No facility-administered encounter medications on file as of 03/08/2024.    Surgical History: Past Surgical History:  Procedure Laterality Date   CESAREAN SECTION     TUBAL LIGATION      Medical History: Past Medical History:  Diagnosis Date   Diabetes (HCC)    Hypertension     Family History: Family History  Problem Relation Age of Onset   Diabetes Mother    Hyperlipidemia Mother    Hypertension Mother    Heart attack Father    Stroke Maternal Grandmother     Social History   Socioeconomic History   Marital status: Married    Spouse name: Not on file   Number of children: Not on file   Years of education: Not on file   Highest education level: Not on file  Occupational History   Not on file  Tobacco Use   Smoking status: Former    Current packs/day: 0.00    Types: Cigarettes    Quit date: 09/11/2021    Years since quitting: 2.5   Smokeless tobacco: Never   Tobacco comments:    Quit in Nov.   Vaping Use   Vaping status: Every Day  Substance and Sexual Activity   Alcohol use: No  Drug use: No   Sexual activity: Not on file  Other Topics Concern   Not on file  Social History Narrative   Not on file   Social Drivers of Health   Financial Resource Strain: Not on file  Food Insecurity: Not on file  Transportation Needs: Not on file  Physical Activity: Not on file  Stress: Not on file  Social Connections: Not on file  Intimate Partner Violence: Not on file      Review of Systems  Constitutional:  Negative for chills and unexpected weight change.  HENT:  Negative for congestion, postnasal drip, rhinorrhea, sneezing and sore throat.   Eyes:  Negative for redness.  Respiratory:  Negative for cough, chest tightness and shortness of breath.   Cardiovascular:  Negative for chest pain and palpitations.   Gastrointestinal:  Negative for abdominal pain, constipation, diarrhea, nausea and vomiting.  Genitourinary:  Negative for dysuria and frequency.  Musculoskeletal:  Negative for back pain, joint swelling and neck pain.  Skin:  Negative for rash.  Neurological:  Negative for tremors and numbness.  Hematological:  Negative for adenopathy. Does not bruise/bleed easily.  Psychiatric/Behavioral:  Positive for sleep disturbance. Negative for behavioral problems (Depression) and suicidal ideas. The patient is nervous/anxious.     Vital Signs: BP 138/80 Comment: 140/82  Pulse 73   Temp 97.8 F (36.6 C)   Resp 16   Ht 5\' 7"  (1.702 m)   Wt 237 lb (107.5 kg)   SpO2 97%   BMI 37.12 kg/m    Physical Exam Vitals and nursing note reviewed.  Constitutional:      General: She is not in acute distress.    Appearance: Normal appearance. She is obese. She is not ill-appearing.  HENT:     Head: Normocephalic and atraumatic.  Cardiovascular:     Rate and Rhythm: Normal rate and regular rhythm.  Pulmonary:     Effort: Pulmonary effort is normal. No respiratory distress.  Musculoskeletal:        General: Normal range of motion.  Skin:    General: Skin is warm and dry.  Neurological:     Mental Status: She is alert and oriented to person, place, and time.  Psychiatric:        Mood and Affect: Mood normal.        Behavior: Behavior normal.        Assessment/Plan: 1. Generalized anxiety disorder May use xanax  as needed  2. Insomnia, unspecified type (Primary) May use xanax  as needed - ALPRAZolam  (XANAX ) 0.25 MG tablet; Take 1 tablet (0.25 mg total) by mouth 2 (two) times daily as needed for anxiety.  Dispense: 60 tablet; Refill: 1  3. OSA (obstructive sleep apnea) Continue cpap nightly  4. Type 2 diabetes mellitus with hyperglycemia, with long-term current use of insulin  (HCC) Continue current medications and monitoring  5. Essential hypertension, benign Continue current  medications   General Counseling: veora fonte understanding of the findings of todays visit and agrees with plan of treatment. I have discussed any further diagnostic evaluation that may be needed or ordered today. We also reviewed her medications today. she has been encouraged to call the office with any questions or concerns that should arise related to todays visit.    No orders of the defined types were placed in this encounter.   Meds ordered this encounter  Medications   ALPRAZolam  (XANAX ) 0.25 MG tablet    Sig: Take 1 tablet (0.25 mg total) by mouth 2 (two) times  daily as needed for anxiety.    Dispense:  60 tablet    Refill:  1    This patient was seen by Taylor Favia, PA-C in collaboration with Dr. Verneta Gone as a part of collaborative care agreement.   Total time spent:30 Minutes Time spent includes review of chart, medications, test results, and follow up plan with the patient.      Dr Fozia M Khan Internal medicine

## 2024-04-02 ENCOUNTER — Other Ambulatory Visit: Payer: Self-pay | Admitting: Physician Assistant

## 2024-04-02 DIAGNOSIS — I10 Essential (primary) hypertension: Secondary | ICD-10-CM

## 2024-04-23 ENCOUNTER — Ambulatory Visit (INDEPENDENT_AMBULATORY_CARE_PROVIDER_SITE_OTHER): Admitting: Physician Assistant

## 2024-04-23 ENCOUNTER — Encounter: Payer: Self-pay | Admitting: Physician Assistant

## 2024-04-23 VITALS — BP 135/68 | HR 95 | Temp 98.3°F | Resp 16 | Ht 67.0 in | Wt 230.8 lb

## 2024-04-23 DIAGNOSIS — I10 Essential (primary) hypertension: Secondary | ICD-10-CM | POA: Diagnosis not present

## 2024-04-23 DIAGNOSIS — Z87898 Personal history of other specified conditions: Secondary | ICD-10-CM | POA: Diagnosis not present

## 2024-04-23 DIAGNOSIS — G4733 Obstructive sleep apnea (adult) (pediatric): Secondary | ICD-10-CM | POA: Diagnosis not present

## 2024-04-23 NOTE — Progress Notes (Signed)
 [facility] [address] [place], [place] [address]  Internal MEDICINE  Office Visit Note  Patient Name: Vicki Simmons  [id number]  [id number]  Date of Service: 04/23/2024  Chief Complaint  Patient presents with   Follow-up   Diabetes   Hypertension   Sleep Apnea    Requesting new sleep apnea mask   Quality Metric Gaps    Needs eye exam    HPI Pt is here for routine follow up -Feeling great for cpap supplies -struggling with cpap use due to mask intolerance, wants to try nasal pillows and will contact DME company about making this change. Only able to wear mask for short periods at night currently. Does not want FF mask option -Just saw neurology, staying on lamictal 100mg  BID of seizures since working well.  -States her MRI from 03/15/24 was reviewed with neurology and did show: focal area of encephalomalacia with minimal gliosis involving the anterior left parietal lobe. Nonspecific periventricular T2 bright foci, most commonly reflecting small vessel ischemic changes -BG 132 this morning -BP stable -working some and thinks this is helping  Current Medication: Outpatient Encounter Medications as of 04/23/2024  Medication Sig   ALPRAZolam  (XANAX ) 0.25 MG tablet Take 1 tablet (0.25 mg total) by mouth 2 (two) times daily as needed for anxiety.   glimepiride  (AMARYL ) 4 MG tablet Take 1 tablet (4 mg total) by mouth 2 (two) times daily.   hydrALAZINE  (APRESOLINE ) 10 MG tablet TAKE 1 TABLET BY MOUTH ONCE DAILY AS NEEDED   hydrALAZINE  (APRESOLINE ) 25 MG tablet Take 1 tablet by mouth twice daily   hydrochlorothiazide  (HYDRODIURIL ) 25 MG tablet Take 1 tablet (25 mg total) by mouth daily.   insulin  NPH Human (NOVOLIN N) 100 UNIT/ML injection Inject 60units [place] QAM and 70 units [place] QPM   lamoTRIgine (LAMICTAL) 25 MG tablet Take 100 mg by mouth 2 (two) times daily.   lisinopril  (ZESTRIL ) 40 MG tablet Take 1 tablet by mouth once daily   metFORMIN  (GLUCOPHAGE ) 1000 MG tablet  Take 1 tablet (1,000 mg total) by mouth 2 (two) times daily with a meal.   potassium chloride  (KLOR-CON ) 10 MEQ tablet Take 1 tablet (10 mEq total) by mouth daily.   rosuvastatin  (CRESTOR ) 5 MG tablet Take 1 tablet (5 mg total) by mouth daily.   traZODone (DESYREL) 50 MG tablet Take 50 mg by mouth at bedtime.   No facility-administered encounter medications on file as of 04/23/2024.    Surgical History: Past Surgical History:  Procedure Laterality Date   CESAREAN SECTION     TUBAL LIGATION      Medical History: Past Medical History:  Diagnosis Date   Diabetes (HCC)    Hypertension     Family History: Family History  Problem Relation Age of Onset   Diabetes Mother    Hyperlipidemia Mother    Hypertension Mother    Heart attack Father    Stroke Maternal Grandmother     Social History   Socioeconomic History   Marital status: Married    Spouse name: Not on file   Number of children: Not on file   Years of education: Not on file   Highest education level: Not on file  Occupational History   Not on file  Tobacco Use   Smoking status: Former    Current packs/day: 0.00    Types: Cigarettes    Quit date: 09/11/2021    Years since quitting: 2.6   Smokeless tobacco: Never   Tobacco comments:  Quit in Nov.   Vaping Use   Vaping status: Every Day  Substance and Sexual Activity   Alcohol use: No   Drug use: No   Sexual activity: Not on file  Other Topics Concern   Not on file  Social History Narrative   Not on file   Social Drivers of Health   Financial Resource Strain: Not on file  Food Insecurity: Not on file  Transportation Needs: Not on file  Physical Activity: Not on file  Stress: Not on file  Social Connections: Not on file  Intimate Partner Violence: Not on file      Review of Systems  Constitutional:  Negative for chills and unexpected weight change.  HENT:  Negative for congestion, postnasal drip, rhinorrhea, sneezing and sore throat.   Eyes:   Negative for redness.  Respiratory:  Negative for cough, chest tightness and shortness of breath.   Cardiovascular:  Negative for chest pain and palpitations.  Gastrointestinal:  Negative for abdominal pain, constipation, diarrhea, nausea and vomiting.  Genitourinary:  Negative for dysuria and frequency.  Musculoskeletal:  Negative for back pain, joint swelling and neck pain.  Skin:  Negative for rash.  Neurological:  Negative for tremors and numbness.  Hematological:  Negative for adenopathy. Does not bruise/bleed easily.  Psychiatric/Behavioral:  Positive for sleep disturbance. Negative for behavioral problems (Depression) and suicidal ideas. The patient is nervous/anxious.     Vital Signs: BP 135/68   Pulse 95   Temp 98.3 F (36.8 C)   Resp 16   Ht 5' 7 (1.702 m)   Wt 230 lb 12.8 oz (104.7 kg)   SpO2 97%   BMI 36.15 kg/m    Physical Exam Vitals and nursing note reviewed.  Constitutional:      General: She is not in acute distress.    Appearance: Normal appearance. She is obese. She is not ill-appearing.  HENT:     Head: Normocephalic and atraumatic.   Cardiovascular:     Rate and Rhythm: Normal rate and regular rhythm.  Pulmonary:     Effort: Pulmonary effort is normal. No respiratory distress.   Musculoskeletal:        General: Normal range of motion.   Skin:    General: Skin is warm and dry.   Neurological:     Mental Status: She is alert and oriented to person, place, and time.   Psychiatric:        Mood and Affect: Mood normal.        Behavior: Behavior normal.        Assessment/Plan: 1. Essential hypertension, benign (Primary) Stable, continue current medications  2. OSA (obstructive sleep apnea) Will follow up with DME company regarding mask change in order to have better compliance with PAP  3. History of seizure Followed by neurology   General Counseling: [facility] understanding of the findings of todays visit and agrees with plan  of treatment. I have discussed any further diagnostic evaluation that may be needed or ordered today. We also reviewed her medications today. she has been encouraged to call the office with any questions or concerns that should arise related to todays visit.    No orders of the defined types were placed in this encounter.   No orders of the defined types were placed in this encounter.   This patient was seen by Tinnie Pro, PA-C in collaboration with Dr. Sigrid Bathe as a part of collaborative care agreement.   Total time spent:30 Minutes Time spent  includes review of chart, medications, test results, and follow up plan with the patient.      Dr Fozia M Khan Internal medicine

## 2024-05-03 ENCOUNTER — Ambulatory Visit: Admitting: Physician Assistant

## 2024-06-01 ENCOUNTER — Other Ambulatory Visit: Payer: Self-pay | Admitting: Physician Assistant

## 2024-06-01 DIAGNOSIS — I10 Essential (primary) hypertension: Secondary | ICD-10-CM

## 2024-06-04 ENCOUNTER — Ambulatory Visit (INDEPENDENT_AMBULATORY_CARE_PROVIDER_SITE_OTHER): Admitting: Physician Assistant

## 2024-06-04 ENCOUNTER — Encounter: Payer: Self-pay | Admitting: Physician Assistant

## 2024-06-04 VITALS — BP 111/70 | HR 85 | Temp 98.3°F | Resp 16 | Ht 67.0 in | Wt 226.9 lb

## 2024-06-04 DIAGNOSIS — R946 Abnormal results of thyroid function studies: Secondary | ICD-10-CM

## 2024-06-04 DIAGNOSIS — E538 Deficiency of other specified B group vitamins: Secondary | ICD-10-CM

## 2024-06-04 DIAGNOSIS — E1165 Type 2 diabetes mellitus with hyperglycemia: Secondary | ICD-10-CM | POA: Diagnosis not present

## 2024-06-04 DIAGNOSIS — E782 Mixed hyperlipidemia: Secondary | ICD-10-CM | POA: Diagnosis not present

## 2024-06-04 DIAGNOSIS — G4733 Obstructive sleep apnea (adult) (pediatric): Secondary | ICD-10-CM

## 2024-06-04 DIAGNOSIS — I10 Essential (primary) hypertension: Secondary | ICD-10-CM | POA: Diagnosis not present

## 2024-06-04 DIAGNOSIS — Z794 Long term (current) use of insulin: Secondary | ICD-10-CM

## 2024-06-04 DIAGNOSIS — R5383 Other fatigue: Secondary | ICD-10-CM

## 2024-06-04 DIAGNOSIS — E559 Vitamin D deficiency, unspecified: Secondary | ICD-10-CM

## 2024-06-04 LAB — POCT GLYCOSYLATED HEMOGLOBIN (HGB A1C): Hemoglobin A1C: 6.6 % — AB (ref 4.0–5.6)

## 2024-06-04 MED ORDER — ROSUVASTATIN CALCIUM 5 MG PO TABS: 5.0000 mg | ORAL_TABLET | Freq: Every day | ORAL | 1 refills | Status: AC

## 2024-06-04 MED ORDER — GLIMEPIRIDE 4 MG PO TABS: 4.0000 mg | ORAL_TABLET | Freq: Two times a day (BID) | ORAL | 1 refills | Status: DC

## 2024-06-04 MED ORDER — LISINOPRIL 40 MG PO TABS: ORAL_TABLET | ORAL | 1 refills | Status: DC

## 2024-06-04 NOTE — Progress Notes (Signed)
 [facility] [address] [place], [place] [address]  Internal MEDICINE  Office Visit Note  Patient Name: Vicki Simmons  [id number]  [id number]  Date of Service: 06/04/2024  Chief Complaint  Patient presents with   Follow-up   Diabetes   Hypertension    HPI Pt is here for routine follow up -BG doing well -BP stable -Waiting for new cpap nasal mask -doing well with neurology, has been 6months since last seizure and reports she is now able to drive -due for eye exam -has been having more tension headaches recently, taking OTC medication. Follows up with neurology in Sept and will be retrying CPAP once she gets new mask which may help -will order labs for CPE  Current Medication: Outpatient Encounter Medications as of 06/04/2024  Medication Sig   ALPRAZolam  (XANAX ) 0.25 MG tablet Take 1 tablet (0.25 mg total) by mouth 2 (two) times daily as needed for anxiety.   hydrALAZINE  (APRESOLINE ) 10 MG tablet TAKE 1 TABLET BY MOUTH ONCE DAILY AS NEEDED   hydrALAZINE  (APRESOLINE ) 25 MG tablet Take 1 tablet by mouth twice daily   hydrochlorothiazide  (HYDRODIURIL ) 25 MG tablet Take 1 tablet by mouth once daily   insulin  NPH Human (NOVOLIN N) 100 UNIT/ML injection Inject 60units [place] QAM and 70 units [place] QPM   lamoTRIgine (LAMICTAL) 25 MG tablet Take 100 mg by mouth 2 (two) times daily.   metFORMIN  (GLUCOPHAGE ) 1000 MG tablet Take 1 tablet (1,000 mg total) by mouth 2 (two) times daily with a meal.   potassium chloride  (KLOR-CON ) 10 MEQ tablet Take 1 tablet (10 mEq total) by mouth daily.   traZODone (DESYREL) 50 MG tablet Take 50 mg by mouth at bedtime.   [DISCONTINUED] glimepiride  (AMARYL ) 4 MG tablet Take 1 tablet (4 mg total) by mouth 2 (two) times daily.   [DISCONTINUED] lisinopril  (ZESTRIL ) 40 MG tablet Take 1 tablet by mouth once daily   [DISCONTINUED] rosuvastatin  (CRESTOR ) 5 MG tablet Take 1 tablet (5 mg total) by mouth daily.   glimepiride  (AMARYL ) 4 MG tablet Take 1 tablet  (4 mg total) by mouth 2 (two) times daily.   lisinopril  (ZESTRIL ) 40 MG tablet Take 1 tablet by mouth once daily   rosuvastatin  (CRESTOR ) 5 MG tablet Take 1 tablet (5 mg total) by mouth daily.   No facility-administered encounter medications on file as of 06/04/2024.    Surgical History: Past Surgical History:  Procedure Laterality Date   CESAREAN SECTION     TUBAL LIGATION      Medical History: Past Medical History:  Diagnosis Date   Diabetes (HCC)    Hypertension     Family History: Family History  Problem Relation Age of Onset   Diabetes Mother    Hyperlipidemia Mother    Hypertension Mother    Heart attack Father    Stroke Maternal Grandmother     Social History   Socioeconomic History   Marital status: Married    Spouse name: Not on file   Number of children: Not on file   Years of education: Not on file   Highest education level: Not on file  Occupational History   Not on file  Tobacco Use   Smoking status: Former    Current packs/day: 0.00    Types: Cigarettes    Quit date: 09/11/2021    Years since quitting: 2.7   Smokeless tobacco: Never   Tobacco comments:    Quit in Nov.   Vaping Use   Vaping status: Every  Day  Substance and Sexual Activity   Alcohol use: No   Drug use: No   Sexual activity: Not on file  Other Topics Concern   Not on file  Social History Narrative   Not on file   Social Drivers of Health   Financial Resource Strain: Not on file  Food Insecurity: Not on file  Transportation Needs: Not on file  Physical Activity: Not on file  Stress: Not on file  Social Connections: Not on file  Intimate Partner Violence: Not on file      Review of Systems  Constitutional:  Negative for chills and unexpected weight change.  HENT:  Negative for congestion, postnasal drip, rhinorrhea, sneezing and sore throat.   Eyes:  Negative for redness.  Respiratory:  Negative for cough, chest tightness and shortness of breath.   Cardiovascular:   Negative for chest pain and palpitations.  Gastrointestinal:  Negative for abdominal pain, constipation, diarrhea, nausea and vomiting.  Genitourinary:  Negative for dysuria and frequency.  Musculoskeletal:  Negative for back pain, joint swelling and neck pain.  Skin:  Negative for rash.  Neurological:  Positive for headaches. Negative for tremors and numbness.  Hematological:  Negative for adenopathy. Does not bruise/bleed easily.  Psychiatric/Behavioral:  Positive for sleep disturbance. Negative for behavioral problems (Depression) and suicidal ideas. The patient is nervous/anxious.     Vital Signs: BP 111/70   Pulse 85   Temp 98.3 F (36.8 C)   Resp 16   Ht 5' 7 (1.702 m)   Wt 226 lb 14.4 oz (102.9 kg)   SpO2 98%   BMI 35.54 kg/m    Physical Exam Vitals and nursing note reviewed.  Constitutional:      General: She is not in acute distress.    Appearance: Normal appearance. She is obese. She is not ill-appearing.  HENT:     Head: Normocephalic and atraumatic.  Cardiovascular:     Rate and Rhythm: Normal rate and regular rhythm.  Pulmonary:     Effort: Pulmonary effort is normal. No respiratory distress.  Musculoskeletal:        General: Normal range of motion.  Skin:    General: Skin is warm and dry.  Neurological:     Mental Status: She is alert and oriented to person, place, and time.  Psychiatric:        Mood and Affect: Mood normal.        Behavior: Behavior normal.        Assessment/Plan: 1. Type 2 diabetes mellitus with hyperglycemia, with long-term current use of insulin  (HCC) (Primary) - POCT HgB A1C is 6.6 which is up from 6.4 last visit. Will continue current medications and monitor - glimepiride  (AMARYL ) 4 MG tablet; Take 1 tablet (4 mg total) by mouth 2 (two) times daily.  Dispense: 180 tablet; Refill: 1 - Hgb A1C w/o eAG  2. Essential hypertension, benign Stable, continue current medications - lisinopril  (ZESTRIL ) 40 MG tablet; Take 1 tablet  by mouth once daily  Dispense: 90 tablet; Refill: 1  3. OSA (obstructive sleep apnea) Restart nightly use with new mask  4. Mixed hyperlipidemia Will continue crestor  and update labs - rosuvastatin  (CRESTOR ) 5 MG tablet; Take 1 tablet (5 mg total) by mouth daily.  Dispense: 90 tablet; Refill: 1 - Lipid Panel With LDL/HDL Ratio  5. Abnormal thyroid  exam - TSH + free T4  6. B12 deficiency - B12 and Folate Panel  7. Vitamin D deficiency - VITAMIN D 25 Hydroxy (Vit-D Deficiency,  Fractures)  8. Other fatigue - CBC w/Diff/Platelet - Comprehensive metabolic panel with GFR - TSH + free T4 - Lipid Panel With LDL/HDL Ratio - Hgb A1C w/o eAG - B12 and Folate Panel - VITAMIN D 25 Hydroxy (Vit-D Deficiency, Fractures)   General Counseling: [facility] understanding of the findings of todays visit and agrees with plan of treatment. I have discussed any further diagnostic evaluation that may be needed or ordered today. We also reviewed her medications today. she has been encouraged to call the office with any questions or concerns that should arise related to todays visit.    Orders Placed This Encounter  Procedures   CBC w/Diff/Platelet   Comprehensive metabolic panel with GFR   TSH + free T4   Lipid Panel With LDL/HDL Ratio   Hgb A1C w/o eAG   B12 and Folate Panel   VITAMIN D 25 Hydroxy (Vit-D Deficiency, Fractures)   POCT HgB A1C    Meds ordered this encounter  Medications   rosuvastatin  (CRESTOR ) 5 MG tablet    Sig: Take 1 tablet (5 mg total) by mouth daily.    Dispense:  90 tablet    Refill:  1   glimepiride  (AMARYL ) 4 MG tablet    Sig: Take 1 tablet (4 mg total) by mouth 2 (two) times daily.    Dispense:  180 tablet    Refill:  1   lisinopril  (ZESTRIL ) 40 MG tablet    Sig: Take 1 tablet by mouth once daily    Dispense:  90 tablet    Refill:  1    This patient was seen by Tinnie Pro, PA-C in collaboration with Dr. Sigrid Bathe as a part of collaborative  care agreement.   Total time spent:30 Minutes Time spent includes review of chart, medications, test results, and follow up plan with the patient.      Dr Fozia M Khan Internal medicine

## 2024-06-29 ENCOUNTER — Other Ambulatory Visit: Payer: Self-pay | Admitting: Physician Assistant

## 2024-06-29 DIAGNOSIS — I10 Essential (primary) hypertension: Secondary | ICD-10-CM

## 2024-08-22 ENCOUNTER — Other Ambulatory Visit: Payer: Self-pay | Admitting: Physician Assistant

## 2024-09-21 ENCOUNTER — Other Ambulatory Visit: Payer: Self-pay | Admitting: Physician Assistant

## 2024-10-02 ENCOUNTER — Ambulatory Visit: Payer: Self-pay | Admitting: Physician Assistant

## 2024-10-02 LAB — CBC WITH DIFFERENTIAL/PLATELET
Basophils Absolute: 0 x10E3/uL (ref 0.0–0.2)
Basos: 0 %
EOS (ABSOLUTE): 0.1 x10E3/uL (ref 0.0–0.4)
Eos: 1 %
Hematocrit: 35.4 % (ref 34.0–46.6)
Hemoglobin: 11.3 g/dL (ref 11.1–15.9)
Immature Grans (Abs): 0 x10E3/uL (ref 0.0–0.1)
Immature Granulocytes: 0 %
Lymphocytes Absolute: 2.2 x10E3/uL (ref 0.7–3.1)
Lymphs: 26 %
MCH: 29.3 pg (ref 26.6–33.0)
MCHC: 31.9 g/dL (ref 31.5–35.7)
MCV: 92 fL (ref 79–97)
Monocytes Absolute: 0.6 x10E3/uL (ref 0.1–0.9)
Monocytes: 8 %
Neutrophils Absolute: 5.5 x10E3/uL (ref 1.4–7.0)
Neutrophils: 65 %
Platelets: 373 x10E3/uL (ref 150–450)
RBC: 3.86 x10E6/uL (ref 3.77–5.28)
RDW: 12.8 % (ref 11.7–15.4)
WBC: 8.5 x10E3/uL (ref 3.4–10.8)

## 2024-10-02 LAB — LIPID PANEL WITH LDL/HDL RATIO
Cholesterol, Total: 158 mg/dL (ref 100–199)
HDL: 47 mg/dL (ref >39)
LDL Chol Calc (NIH): 91 mg/dL (ref 0–99)
LDL/HDL Ratio: 1.9 ratio (ref 0.0–3.2)
Triglycerides: 111 mg/dL (ref 0–149)
VLDL Cholesterol Cal: 20 mg/dL (ref 5–40)

## 2024-10-02 LAB — COMPREHENSIVE METABOLIC PANEL WITH GFR
ALT: 16 IU/L (ref 0–32)
AST: 15 IU/L (ref 0–40)
Albumin: 4.5 g/dL (ref 3.9–4.9)
Alkaline Phosphatase: 54 IU/L (ref 41–116)
BUN/Creatinine Ratio: 19 (ref 9–23)
BUN: 11 mg/dL (ref 6–24)
Bilirubin Total: 0.2 mg/dL (ref 0.0–1.2)
CO2: 23 mmol/L (ref 20–29)
Calcium: 9.5 mg/dL (ref 8.7–10.2)
Chloride: 97 mmol/L (ref 96–106)
Creatinine, Ser: 0.59 mg/dL (ref 0.57–1.00)
Globulin, Total: 2.1 g/dL (ref 1.5–4.5)
Glucose: 147 mg/dL — ABNORMAL HIGH (ref 70–99)
Potassium: 3.9 mmol/L (ref 3.5–5.2)
Sodium: 136 mmol/L (ref 134–144)
Total Protein: 6.6 g/dL (ref 6.0–8.5)
eGFR: 112 mL/min/1.73 (ref >59)

## 2024-10-02 LAB — TSH+FREE T4
Free T4: 0.98 ng/dL (ref 0.82–1.77)
TSH: 0.869 u[IU]/mL (ref 0.450–4.500)

## 2024-10-02 LAB — B12 AND FOLATE PANEL
Folate: >20 ng/mL (ref >3.0)
Vitamin B-12: 1094 pg/mL (ref 232–1245)

## 2024-10-02 LAB — HGB A1C W/O EAG: Hgb A1c MFr Bld: 6.8 % — ABNORMAL HIGH (ref 4.8–5.6)

## 2024-10-02 LAB — VITAMIN D 25 HYDROXY (VIT D DEFICIENCY, FRACTURES): Vit D, 25-Hydroxy: 47.8 ng/mL (ref 30.0–100.0)

## 2024-10-04 ENCOUNTER — Ambulatory Visit: Payer: BLUE CROSS/BLUE SHIELD | Admitting: Physician Assistant

## 2024-10-04 ENCOUNTER — Encounter: Payer: Self-pay | Admitting: Physician Assistant

## 2024-10-04 VITALS — BP 125/78 | HR 79 | Temp 98.0°F | Resp 16 | Ht 67.0 in | Wt 229.0 lb

## 2024-10-04 DIAGNOSIS — E669 Obesity, unspecified: Secondary | ICD-10-CM | POA: Diagnosis not present

## 2024-10-04 DIAGNOSIS — S92534D Nondisplaced fracture of distal phalanx of right lesser toe(s), subsequent encounter for fracture with routine healing: Secondary | ICD-10-CM | POA: Diagnosis not present

## 2024-10-04 DIAGNOSIS — I10 Essential (primary) hypertension: Secondary | ICD-10-CM | POA: Diagnosis not present

## 2024-10-04 DIAGNOSIS — Z0001 Encounter for general adult medical examination with abnormal findings: Secondary | ICD-10-CM

## 2024-10-04 DIAGNOSIS — G4733 Obstructive sleep apnea (adult) (pediatric): Secondary | ICD-10-CM

## 2024-10-04 DIAGNOSIS — Z1231 Encounter for screening mammogram for malignant neoplasm of breast: Secondary | ICD-10-CM

## 2024-10-04 DIAGNOSIS — R300 Dysuria: Secondary | ICD-10-CM

## 2024-10-04 DIAGNOSIS — Z794 Long term (current) use of insulin: Secondary | ICD-10-CM

## 2024-10-04 DIAGNOSIS — E1165 Type 2 diabetes mellitus with hyperglycemia: Secondary | ICD-10-CM

## 2024-10-04 MED ORDER — OZEMPIC (0.25 OR 0.5 MG/DOSE) 2 MG/3ML ~~LOC~~ SOPN: 0.2500 mg | PEN_INJECTOR | SUBCUTANEOUS | 2 refills | Status: DC

## 2024-10-04 NOTE — Progress Notes (Signed)
 [facility] [address] [place], [place] [address]  Internal MEDICINE  Office Visit Note  Patient Name: Vicki Simmons  [id number]  [id number]  Date of Service: 10/04/2024  Chief Complaint  Patient presents with   Annual Exam   Diabetes   Hypertension   Medication Refill    Glimepiride , Lisinopril , Metformin      HPI Pt is here for routine health maintenance examination -Labs reviewed and all look good, A1c at 6.8 -open to mammogram this year and will order -eye exam will be on 12/31, states neurology also wanted her to have an eye exam -Doing well with neurology, annual visits now. No more seizures -BP well controlled -Insulin  60units evening, 40in AM and doing well with oral meds as well. Would like to revisit GLP1 now that insurance has changed to see if more affordable. No personal or Fhx of thyroid  CA. Thinks this will help wt too as she already walked 7-7miles per day at work. States she has thought about bariatric surgery too given other health concerns, but would like to try to avoid this -broke right pinky toe in October. Saw podiatry at [facility] and may follow up again due to some pain still. -Wearing CPAP nightly now. States she has been doing better with this and puts mask back on if she gets up to restroom. States she accidentally received wrong seal in mail and likes it better--memory foam now.  Current Medication: Outpatient Encounter Medications as of 10/04/2024  Medication Sig   hydrALAZINE  (APRESOLINE ) 10 MG tablet TAKE 1 TABLET BY MOUTH ONCE DAILY AS NEEDED   hydrALAZINE  (APRESOLINE ) 25 MG tablet Take 1 tablet by mouth twice daily   hydrochlorothiazide  (HYDRODIURIL ) 25 MG tablet Take 1 tablet by mouth once daily   insulin  NPH Human (NOVOLIN N) 100 UNIT/ML injection Inject 60units [place] QAM and 70 units [place] QPM   lamoTRIgine (LAMICTAL) 25 MG tablet Take 100 mg by mouth 2 (two) times daily.   potassium chloride  (KLOR-CON ) 10 MEQ tablet Take 1  tablet (10 mEq total) by mouth daily.   rosuvastatin  (CRESTOR ) 5 MG tablet Take 1 tablet (5 mg total) by mouth daily.   Semaglutide,0.25 or 0.5MG /DOS, (OZEMPIC, 0.25 OR 0.5 MG/DOSE,) 2 MG/3ML SOPN Inject 0.25 mg into the skin once a week.   [DISCONTINUED] ALPRAZolam  (XANAX ) 0.25 MG tablet Take 1 tablet (0.25 mg total) by mouth 2 (two) times daily as needed for anxiety.   [DISCONTINUED] glimepiride  (AMARYL ) 4 MG tablet Take 1 tablet (4 mg total) by mouth 2 (two) times daily.   [DISCONTINUED] lisinopril  (ZESTRIL ) 40 MG tablet Take 1 tablet by mouth once daily   [DISCONTINUED] metFORMIN  (GLUCOPHAGE ) 1000 MG tablet Take 1 tablet (1,000 mg total) by mouth 2 (two) times daily with a meal.   [DISCONTINUED] traZODone (DESYREL) 50 MG tablet Take 50 mg by mouth at bedtime.   glimepiride  (AMARYL ) 4 MG tablet Take 1 tablet (4 mg total) by mouth 2 (two) times daily.   lisinopril  (ZESTRIL ) 40 MG tablet Take 1 tablet by mouth once daily   metFORMIN  (GLUCOPHAGE ) 1000 MG tablet Take 1 tablet (1,000 mg total) by mouth 2 (two) times daily with a meal.   No facility-administered encounter medications on file as of 10/04/2024.    Surgical History: Past Surgical History:  Procedure Laterality Date   CESAREAN SECTION     TUBAL LIGATION      Medical History: Past Medical History:  Diagnosis Date   Diabetes (HCC)    Hypertension  Family History: Family History  Problem Relation Age of Onset   Diabetes Mother    Hyperlipidemia Mother    Hypertension Mother    Heart attack Father    Stroke Maternal Grandmother       Review of Systems  Constitutional:  Negative for chills and unexpected weight change.  HENT:  Negative for congestion, postnasal drip, rhinorrhea, sneezing and sore throat.   Eyes:  Negative for redness.  Respiratory:  Negative for cough, chest tightness and shortness of breath.   Cardiovascular:  Negative for chest pain and palpitations.  Gastrointestinal:  Negative for abdominal  pain, constipation, diarrhea, nausea and vomiting.  Genitourinary:  Negative for dysuria and frequency.  Musculoskeletal:  Positive for arthralgias. Negative for back pain, joint swelling and neck pain.       Right pinky toe pain still  Skin:  Negative for rash.  Neurological:  Negative for tremors and numbness.  Hematological:  Negative for adenopathy. Does not bruise/bleed easily.  Psychiatric/Behavioral:  Negative for behavioral problems (Depression) and suicidal ideas.      Vital Signs: BP 125/78   Pulse 79   Temp 98 F (36.7 C)   Resp 16   Ht 5' 7 (1.702 m)   Wt 229 lb (103.9 kg)   SpO2 95%   BMI 35.87 kg/m    Physical Exam Vitals and nursing note reviewed.  Constitutional:      General: She is not in acute distress.    Appearance: Normal appearance. She is obese. She is not ill-appearing.  HENT:     Head: Normocephalic and atraumatic.     Mouth/Throat:     Pharynx: No posterior oropharyngeal erythema.  Eyes:     Pupils: Pupils are equal, round, and reactive to light.  Cardiovascular:     Rate and Rhythm: Normal rate and regular rhythm.     Pulses:          Dorsalis pedis pulses are 3+ on the right side and 3+ on the left side.       Posterior tibial pulses are 3+ on the right side and 3+ on the left side.  Pulmonary:     Effort: Pulmonary effort is normal. No respiratory distress.  Abdominal:     Palpations: Abdomen is soft.     Tenderness: There is no abdominal tenderness.  Musculoskeletal:        General: Normal range of motion.     Right foot: Normal range of motion.     Left foot: Normal range of motion.  Feet:     Right foot:     Protective Sensation: 3 sites tested.  3 sites sensed.     Skin integrity: Skin integrity normal.     Toenail Condition: Right toenails are normal.     Left foot:     Protective Sensation: 3 sites tested.  3 sites sensed.     Skin integrity: Skin integrity normal.     Toenail Condition: Left toenails are normal.      Comments: Right pinky toe slightly swollen and tender (recently broken 2 months ago) Skin:    General: Skin is warm and dry.  Neurological:     Mental Status: She is alert and oriented to person, place, and time.  Psychiatric:        Mood and Affect: Mood normal.        Behavior: Behavior normal.      LABS: Recent Results (from the past 2160 hours)  CBC w/Diff/Platelet  Status: None   Collection Time: 10/01/24  1:26 PM  Result Value Ref Range   WBC 8.5 3.4 - 10.8 x10E3/uL   RBC 3.86 3.77 - 5.28 x10E6/uL   Hemoglobin 11.3 11.1 - 15.9 g/dL   Hematocrit 64.5 65.9 - 46.6 %   MCV 92 79 - 97 fL   MCH 29.3 26.6 - 33.0 pg   MCHC 31.9 31.5 - 35.7 g/dL   RDW 87.1 88.2 - 84.5 %   Platelets 373 150 - 450 x10E3/uL   Neutrophils 65 Not Estab. %   Lymphs 26 Not Estab. %   Monocytes 8 Not Estab. %   Eos 1 Not Estab. %   Basos 0 Not Estab. %   Neutrophils Absolute 5.5 1.4 - 7.0 x10E3/uL   Lymphocytes Absolute 2.2 0.7 - 3.1 x10E3/uL   Monocytes Absolute 0.6 0.1 - 0.9 x10E3/uL   EOS (ABSOLUTE) 0.1 0.0 - 0.4 x10E3/uL   Basophils Absolute 0.0 0.0 - 0.2 x10E3/uL   Immature Granulocytes 0 Not Estab. %   Immature Grans (Abs) 0.0 0.0 - 0.1 x10E3/uL  Comprehensive metabolic panel with GFR     Status: Abnormal   Collection Time: 10/01/24  1:26 PM  Result Value Ref Range   Glucose 147 (H) 70 - 99 mg/dL   BUN 11 6 - 24 mg/dL   Creatinine, Ser 9.40 0.57 - 1.00 mg/dL   eGFR 887 >40 fO/fpw/8.26   BUN/Creatinine Ratio 19 9 - 23   Sodium 136 134 - 144 mmol/L   Potassium 3.9 3.5 - 5.2 mmol/L   Chloride 97 96 - 106 mmol/L   CO2 23 20 - 29 mmol/L   Calcium  9.5 8.7 - 10.2 mg/dL   Total Protein 6.6 6.0 - 8.5 g/dL   Albumin 4.5 3.9 - 4.9 g/dL   Globulin, Total 2.1 1.5 - 4.5 g/dL   Bilirubin Total 0.2 0.0 - 1.2 mg/dL   Alkaline Phosphatase 54 41 - 116 IU/L   AST 15 0 - 40 IU/L   ALT 16 0 - 32 IU/L  TSH + free T4     Status: None   Collection Time: 10/01/24  1:26 PM  Result Value Ref Range    TSH 0.869 0.450 - 4.500 uIU/mL   Free T4 0.98 0.82 - 1.77 ng/dL  Lipid Panel With LDL/HDL Ratio     Status: None   Collection Time: 10/01/24  1:26 PM  Result Value Ref Range   Cholesterol, Total 158 100 - 199 mg/dL   Triglycerides 888 0 - 149 mg/dL   HDL 47 >60 mg/dL   VLDL Cholesterol Cal 20 5 - 40 mg/dL   LDL Chol Calc (NIH) 91 0 - 99 mg/dL   LDL/HDL Ratio 1.9 0.0 - 3.2 ratio    Comment:                                     LDL/HDL Ratio                                             Men  Women                               1/2 Avg.Risk  1.0    1.5  Avg.Risk  3.6    3.2                                2X Avg.Risk  6.2    5.0                                3X Avg.Risk  8.0    6.1   Hgb A1C w/o eAG     Status: Abnormal   Collection Time: 10/01/24  1:26 PM  Result Value Ref Range   Hgb A1c MFr Bld 6.8 (H) 4.8 - 5.6 %    Comment:          Prediabetes: 5.7 - 6.4          Diabetes: >6.4          Glycemic control for adults with diabetes: <7.0   B12 and Folate Panel     Status: None   Collection Time: 10/01/24  1:26 PM  Result Value Ref Range   Vitamin B-12 1,094 232 - 1,245 pg/mL   Folate >20.0 >3.0 ng/mL    Comment: A serum folate concentration of less than 3.1 ng/mL is considered to represent clinical deficiency.   VITAMIN D  25 Hydroxy (Vit-D Deficiency, Fractures)     Status: None   Collection Time: 10/01/24  1:26 PM  Result Value Ref Range   Vit D, 25-Hydroxy 47.8 30.0 - 100.0 ng/mL    Comment: Vitamin D  deficiency has been defined by the Institute of Medicine and an Endocrine Society practice guideline as a level of serum 25-OH vitamin D  less than 20 ng/mL (1,2). The Endocrine Society went on to further define vitamin D  insufficiency as a level between 21 and 29 ng/mL (2). 1. IOM (Institute of Medicine). 2010. Dietary reference    intakes for calcium  and D. [place]: The    [employer]. 2. Holick MF, [place],  Bischoff-Ferrari HA, et al.    Evaluation, treatment, and prevention of vitamin D     deficiency: an Endocrine Society clinical practice    guideline. JCEM. 2011 Jul; 96(7):1911-30.         Assessment/Plan: 1. Encounter for general adult medical examination with abnormal findings (Primary) CPE performed, labs reviewed, due for mammogram and eye exam  2. Type 2 diabetes mellitus with hyperglycemia, with long-term current use of insulin  (HCC) A1c at 6.8 and is controlled. Will try to restart GLP1 for further benefits and hopefully decrease other meds. Continue to monitor. Eye exam 10/24/24 - Urine Microalbumin w/creat. ratio - glimepiride  (AMARYL ) 4 MG tablet; Take 1 tablet (4 mg total) by mouth 2 (two) times daily.  Dispense: 180 tablet; Refill: 1 - metFORMIN  (GLUCOPHAGE ) 1000 MG tablet; Take 1 tablet (1,000 mg total) by mouth 2 (two) times daily with a meal.  Dispense: 180 tablet; Refill: 3 - Semaglutide,0.25 or 0.5MG /DOS, (OZEMPIC, 0.25 OR 0.5 MG/DOSE,) 2 MG/3ML SOPN; Inject 0.25 mg into the skin once a week.  Dispense: 3 mL; Refill: 2  3. Essential hypertension, benign Well controlled, continue current medication - lisinopril  (ZESTRIL ) 40 MG tablet; Take 1 tablet by mouth once daily  Dispense: 90 tablet; Refill: 1  4. OSA (obstructive sleep apnea) Continue cpap nightly  5. Visit for screening mammogram - MM 3D SCREENING MAMMOGRAM BILATERAL BREAST; Future  6. Closed nondisplaced fracture of distal phalanx of lesser toe of right foot with routine  healing, subsequent encounter Continues to have pain and swelling, will follow back up with podiatry  7. Obesity (BMI 30-39.9) Continue to work on diet and exercise and will try to start GLP1 to help BG and wt loss benefits  8. Dysuria - UA/M w/rflx Culture, Routine   General Counseling: [place] understanding of the findings of todays visit and agrees with plan of treatment. I have discussed any further diagnostic evaluation  that may be needed or ordered today. We also reviewed her medications today. she has been encouraged to call the office with any questions or concerns that should arise related to todays visit.    Counseling:    Orders Placed This Encounter  Procedures   MM 3D SCREENING MAMMOGRAM BILATERAL BREAST   UA/M w/rflx Culture, Routine   Urine Microalbumin w/creat. ratio    Meds ordered this encounter  Medications   glimepiride  (AMARYL ) 4 MG tablet    Sig: Take 1 tablet (4 mg total) by mouth 2 (two) times daily.    Dispense:  180 tablet    Refill:  1   lisinopril  (ZESTRIL ) 40 MG tablet    Sig: Take 1 tablet by mouth once daily    Dispense:  90 tablet    Refill:  1   metFORMIN  (GLUCOPHAGE ) 1000 MG tablet    Sig: Take 1 tablet (1,000 mg total) by mouth 2 (two) times daily with a meal.    Dispense:  180 tablet    Refill:  3    Please see dose change and fill 1000mg  not 500mg  dose   Semaglutide,0.25 or 0.5MG /DOS, (OZEMPIC, 0.25 OR 0.5 MG/DOSE,) 2 MG/3ML SOPN    Sig: Inject 0.25 mg into the skin once a week.    Dispense:  3 mL    Refill:  2    This patient was seen by Tinnie Pro, PA-C in collaboration with Dr. Sigrid Bathe as a part of collaborative care agreement.  Total time spent:35 Minutes  Time spent includes review of chart, medications, test results, and follow up plan with the patient.     Sigrid CHRISTELLA Bathe, MD  Internal Medicine

## 2024-10-05 LAB — MICROSCOPIC EXAMINATION
Bacteria, UA: NONE SEEN
Casts: NONE SEEN /LPF
WBC, UA: NONE SEEN /HPF (ref 0–5)

## 2024-10-05 LAB — UA/M W/RFLX CULTURE, ROUTINE
Bilirubin, UA: NEGATIVE
Glucose, UA: NEGATIVE
Ketones, UA: NEGATIVE
Leukocytes,UA: NEGATIVE
Nitrite, UA: NEGATIVE
Protein,UA: NEGATIVE
RBC, UA: NEGATIVE
Specific Gravity, UA: 1.023 (ref 1.005–1.030)
Urobilinogen, Ur: 0.2 mg/dL (ref 0.2–1.0)
pH, UA: 6.5 (ref 5.0–7.5)

## 2024-10-05 LAB — MICROALBUMIN / CREATININE URINE RATIO
Creatinine, Urine: 33.8 mg/dL
Microalb/Creat Ratio: <9 mg/g{creat} (ref 0–29)
Microalbumin, Urine: <3 ug/mL

## 2024-11-01 ENCOUNTER — Encounter: Payer: Self-pay | Admitting: Physician Assistant

## 2024-11-01 ENCOUNTER — Ambulatory Visit (INDEPENDENT_AMBULATORY_CARE_PROVIDER_SITE_OTHER): Admitting: Physician Assistant

## 2024-11-01 VITALS — BP 121/76 | HR 77 | Temp 98.0°F | Resp 16 | Ht 67.0 in | Wt 227.0 lb

## 2024-11-01 DIAGNOSIS — Z794 Long term (current) use of insulin: Secondary | ICD-10-CM | POA: Diagnosis not present

## 2024-11-01 DIAGNOSIS — E669 Obesity, unspecified: Secondary | ICD-10-CM | POA: Diagnosis not present

## 2024-11-01 DIAGNOSIS — I10 Essential (primary) hypertension: Secondary | ICD-10-CM | POA: Diagnosis not present

## 2024-11-01 DIAGNOSIS — E1165 Type 2 diabetes mellitus with hyperglycemia: Secondary | ICD-10-CM

## 2024-11-01 MED ORDER — OZEMPIC (0.25 OR 0.5 MG/DOSE) 2 MG/3ML ~~LOC~~ SOPN: 0.5000 mg | PEN_INJECTOR | SUBCUTANEOUS | 2 refills | Status: AC

## 2024-11-01 NOTE — Progress Notes (Signed)
 [facility] [address] [place], [place] [address]  Internal MEDICINE  Office Visit Note  Patient Name: Vicki Simmons  [id number]  [id number]  Date of Service: 11/01/2024  Chief Complaint  Patient presents with   Follow-up   Diabetes   Hypertension    HPI Pt is here for routine follow up -Started on ozempic , tolerating well but not seeing big difference. Would like to increase dose -taking on Sundays, has taken 4 weeks now -BG doing well, no lows and is watching closely. Knows meds may need to be adjusted as ozempic  increased -BP stable  Current Medication: Outpatient Encounter Medications as of 11/01/2024  Medication Sig   glimepiride  (AMARYL ) 4 MG tablet Take 1 tablet (4 mg total) by mouth 2 (two) times daily.   hydrALAZINE  (APRESOLINE ) 10 MG tablet TAKE 1 TABLET BY MOUTH ONCE DAILY AS NEEDED   hydrALAZINE  (APRESOLINE ) 25 MG tablet Take 1 tablet by mouth twice daily   hydrochlorothiazide  (HYDRODIURIL ) 25 MG tablet Take 1 tablet by mouth once daily   insulin  NPH Human (NOVOLIN N) 100 UNIT/ML injection Inject 60units [place] QAM and 70 units [place] QPM   lamoTRIgine (LAMICTAL) 25 MG tablet Take 100 mg by mouth 2 (two) times daily.   lisinopril  (ZESTRIL ) 40 MG tablet Take 1 tablet by mouth once daily   metFORMIN  (GLUCOPHAGE ) 1000 MG tablet Take 1 tablet (1,000 mg total) by mouth 2 (two) times daily with a meal.   potassium chloride  (KLOR-CON ) 10 MEQ tablet Take 1 tablet (10 mEq total) by mouth daily.   rosuvastatin  (CRESTOR ) 5 MG tablet Take 1 tablet (5 mg total) by mouth daily.   Semaglutide ,0.25 or 0.5MG /DOS, (OZEMPIC , 0.25 OR 0.5 MG/DOSE,) 2 MG/3ML SOPN Inject 0.5 mg into the skin once a week.   [DISCONTINUED] Semaglutide ,0.25 or 0.5MG /DOS, (OZEMPIC , 0.25 OR 0.5 MG/DOSE,) 2 MG/3ML SOPN Inject 0.25 mg into the skin once a week.   No facility-administered encounter medications on file as of 11/01/2024.    Surgical History: Past Surgical History:  Procedure Laterality  Date   CESAREAN SECTION     TUBAL LIGATION      Medical History: Past Medical History:  Diagnosis Date   Diabetes (HCC)    Hypertension     Family History: Family History  Problem Relation Age of Onset   Diabetes Mother    Hyperlipidemia Mother    Hypertension Mother    Heart attack Father    Stroke Maternal Grandmother     Social History   Socioeconomic History   Marital status: Married    Spouse name: Not on file   Number of children: Not on file   Years of education: Not on file   Highest education level: Not on file  Occupational History   Not on file  Tobacco Use   Smoking status: Former    Current packs/day: 0.00    Average packs/day: 1.0 packs/day    Types: Cigarettes    Quit date: 09/11/2021    Years since quitting: 3.1   Smokeless tobacco: Never   Tobacco comments:    Quit in Nov.   Vaping Use   Vaping status: Every Day  Substance and Sexual Activity   Alcohol use: No   Drug use: No   Sexual activity: Not on file  Other Topics Concern   Not on file  Social History Narrative   Not on file   Social Drivers of Health   Tobacco Use: Medium Risk (11/01/2024)   Patient History  Smoking Tobacco Use: Former    Smokeless Tobacco Use: Never    Passive Exposure: Not on [place] Strain: Not on file  Food Insecurity: Not on file  Transportation Needs: Not on file  Physical Activity: Not on file  Stress: Not on file  Social Connections: Not on file  Intimate Partner Violence: Not on file  Depression (PHQ2-9): Low Risk (10/04/2024)   Depression (PHQ2-9)    PHQ-2 Score: 0  Alcohol Screen: Low Risk (12/22/2023)   Alcohol Screen    Last Alcohol Screening Score (AUDIT): 2  Housing: Unknown (11/14/2023)   Received from [facility]   Epic    Unable to Pay for Housing in the Last Year: Not on file    Number of Times Moved in the Last Year: Not on file    At any time in the past 12 months, were you homeless or living in a  shelter (including now)?: No  Utilities: Not on file  Health Literacy: Not on file      Review of Systems  Constitutional:  Negative for chills and unexpected weight change.  HENT:  Negative for congestion, postnasal drip, rhinorrhea, sneezing and sore throat.   Eyes:  Negative for redness.  Respiratory:  Negative for cough, chest tightness and shortness of breath.   Cardiovascular:  Negative for chest pain and palpitations.  Gastrointestinal:  Negative for abdominal pain, constipation, diarrhea, nausea and vomiting.  Genitourinary:  Negative for dysuria and frequency.  Musculoskeletal:  Negative for back pain, joint swelling and neck pain.  Skin:  Negative for rash.  Neurological:  Negative for tremors and numbness.  Hematological:  Negative for adenopathy. Does not bruise/bleed easily.  Psychiatric/Behavioral:  Negative for behavioral problems (Depression) and suicidal ideas.     Vital Signs: BP 121/76   Pulse 77   Temp 98 F (36.7 C)   Resp 16   Ht 5' 7 (1.702 m)   Wt 227 lb (103 kg)   SpO2 97%   BMI 35.55 kg/m    Physical Exam Vitals and nursing note reviewed.  Constitutional:      General: She is not in acute distress.    Appearance: Normal appearance. She is obese. She is not ill-appearing.  HENT:     Head: Normocephalic and atraumatic.  Cardiovascular:     Rate and Rhythm: Normal rate and regular rhythm.  Pulmonary:     Effort: Pulmonary effort is normal. No respiratory distress.  Musculoskeletal:        General: Normal range of motion.  Skin:    General: Skin is warm and dry.  Neurological:     Mental Status: She is alert and oriented to person, place, and time.  Psychiatric:        Mood and Affect: Mood normal.        Behavior: Behavior normal.        Assessment/Plan: 1. Type 2 diabetes mellitus with hyperglycemia, with long-term current use of insulin  (HCC) (Primary) Will increase to .5mg  weekly. Monitor BG closely, will plan to reduce insulin   as BG improves with ozempic  use to avoid lows  - Semaglutide ,0.25 or 0.5MG /DOS, (OZEMPIC , 0.25 OR 0.5 MG/DOSE,) 2 MG/3ML SOPN; Inject 0.5 mg into the skin once a week.  Dispense: 3 mL; Refill: 2  2. Essential hypertension, benign Stable, continue current medication  3. Obesity (BMI 30-39.9) Down 2lbs since last visit and will continue to work on this in addition to continuing ozempic  for BG and wt loss  benefits   General Counseling: [place] understanding of the findings of todays visit and agrees with plan of treatment. I have discussed any further diagnostic evaluation that may be needed or ordered today. We also reviewed her medications today. she has been encouraged to call the office with any questions or concerns that should arise related to todays visit.    No orders of the defined types were placed in this encounter.   Meds ordered this encounter  Medications   Semaglutide ,0.25 or 0.5MG /DOS, (OZEMPIC , 0.25 OR 0.5 MG/DOSE,) 2 MG/3ML SOPN    Sig: Inject 0.5 mg into the skin once a week.    Dispense:  3 mL    Refill:  2    This patient was seen by Tinnie Pro, PA-C in collaboration with Dr. Sigrid Bathe as a part of collaborative care agreement.   Total time spent:30 Minutes Time spent includes review of chart, medications, test results, and follow up plan with the patient.      Dr Fozia M Khan Internal medicine

## 2024-11-13 ENCOUNTER — Ambulatory Visit
Admission: RE | Admit: 2024-11-13 | Discharge: 2024-11-13 | Disposition: A | Discharge status: Home / Self Care | Source: Ambulatory Visit | Attending: Physician Assistant | Admitting: Physician Assistant

## 2024-11-13 DIAGNOSIS — Z1231 Encounter for screening mammogram for malignant neoplasm of breast: Secondary | ICD-10-CM | POA: Diagnosis present

## 2024-11-15 ENCOUNTER — Ambulatory Visit: Admitting: Physician Assistant

## 2024-11-15 ENCOUNTER — Encounter: Payer: Self-pay | Admitting: Physician Assistant

## 2024-11-15 VITALS — BP 134/72 | HR 85 | Temp 98.0°F | Resp 16 | Ht 67.0 in | Wt 219.0 lb

## 2024-11-15 DIAGNOSIS — R350 Frequency of micturition: Secondary | ICD-10-CM

## 2024-11-15 DIAGNOSIS — S20221A Contusion of right back wall of thorax, initial encounter: Secondary | ICD-10-CM

## 2024-11-15 DIAGNOSIS — M549 Dorsalgia, unspecified: Secondary | ICD-10-CM

## 2024-11-15 DIAGNOSIS — R10A1 Flank pain, right side: Secondary | ICD-10-CM

## 2024-11-15 NOTE — Progress Notes (Signed)
 " [facility] [address] [place], [place] [address]  Internal MEDICINE  Office Visit Note  Patient Name: Vicki Simmons  [id number]  [id number]  Date of Service: 11/15/2024  Chief Complaint  Patient presents with   Acute Visit   Back Pain    Right back pain, some bruising. Muscle relaxer is helping.     HPI Pt is here for a sick visit. -back started hurting on right side after work Saturday, noticed a knot on this side as well. Hx of midline pain but this is new -not aware of any injury, but does have bruising present as well -ortho did take xray which was ok, but does have MRI ordered to check spine in general given long term midline pain. Advised to see PCP given location and bruising -was given methocarbamol and is taking as needed and is helping pain and was given steroid pack but did not take it. -no change in urination, does normally have frequency at baseline and drinks lots of water. No change. -Decrease in appetite but attributes this to ozempic  -No change in BM, no blood in stool or urine -not really taking NSAIDs, occasionally takes OTC tension headache relief   Current Medication:  Outpatient Encounter Medications as of 11/15/2024  Medication Sig   glimepiride  (AMARYL ) 4 MG tablet Take 1 tablet (4 mg total) by mouth 2 (two) times daily.   hydrALAZINE  (APRESOLINE ) 10 MG tablet TAKE 1 TABLET BY MOUTH ONCE DAILY AS NEEDED   hydrALAZINE  (APRESOLINE ) 25 MG tablet Take 1 tablet by mouth twice daily   hydrochlorothiazide  (HYDRODIURIL ) 25 MG tablet Take 1 tablet by mouth once daily   insulin  NPH Human (NOVOLIN N) 100 UNIT/ML injection Inject 60units [place] QAM and 70 units [place] QPM   lamoTRIgine (LAMICTAL) 25 MG tablet Take 100 mg by mouth 2 (two) times daily.   lisinopril  (ZESTRIL ) 40 MG tablet Take 1 tablet by mouth once daily   metFORMIN  (GLUCOPHAGE ) 1000 MG tablet Take 1 tablet (1,000 mg total) by mouth 2 (two) times daily with a meal.   potassium chloride   (KLOR-CON ) 10 MEQ tablet Take 1 tablet (10 mEq total) by mouth daily.   rosuvastatin  (CRESTOR ) 5 MG tablet Take 1 tablet (5 mg total) by mouth daily.   Semaglutide ,0.25 or 0.5MG /DOS, (OZEMPIC , 0.25 OR 0.5 MG/DOSE,) 2 MG/3ML SOPN Inject 0.5 mg into the skin once a week.   No facility-administered encounter medications on file as of 11/15/2024.      Medical History: Past Medical History:  Diagnosis Date   Diabetes (HCC)    Hypertension      Vital Signs: BP 134/72   Pulse 85   Temp 98 F (36.7 C)   Resp 16   Ht 5' 7 (1.702 m)   Wt 219 lb (99.3 kg)   LMP 10/05/2024   SpO2 95%   BMI 34.30 kg/m    Review of Systems  Constitutional:  Negative for fatigue and fever.  HENT:  Negative for congestion, mouth sores and postnasal drip.   Respiratory:  Negative for cough.   Cardiovascular:  Negative for chest pain.  Gastrointestinal:  Negative for blood in stool.  Genitourinary:  Positive for frequency. Negative for dysuria, flank pain and hematuria.  Musculoskeletal:  Positive for back pain and myalgias.  Skin:  Positive for color change.  Psychiatric/Behavioral: Negative.      Physical Exam Vitals and nursing note reviewed.  Constitutional:      General: She is not in acute distress.  Appearance: Normal appearance. She is obese. She is not ill-appearing.  HENT:     Head: Normocephalic and atraumatic.  Cardiovascular:     Rate and Rhythm: Normal rate and regular rhythm.  Pulmonary:     Effort: Pulmonary effort is normal. No respiratory distress.  Musculoskeletal:        General: Tenderness present. Normal range of motion.     Comments: Bruising over lower right back, tenderness along right low back/flank  Skin:    General: Skin is warm and dry.  Neurological:     Mental Status: She is alert and oriented to person, place, and time.  Psychiatric:        Mood and Affect: Mood normal.        Behavior: Behavior normal.       Assessment/Plan: 1. Contusion of right  side of back, initial encounter (Primary) Will order CT for further evaluation given sudden bruising/back and flank pain without any known injury or cause - CT ABDOMEN PELVIS WO CONTRAST; Future  2. Acute right-sided back pain, unspecified back location - CT ABDOMEN PELVIS WO CONTRAST; Future  3. Right flank pain - CT ABDOMEN PELVIS WO CONTRAST; Future  4. Frequent urination - CT ABDOMEN PELVIS WO CONTRAST; Future   General Counseling: Vicki Simmons understanding of the findings of todays visit and agrees with plan of treatment. I have discussed any further diagnostic evaluation that may be needed or ordered today. We also reviewed her medications today. she has been encouraged to call the office with any questions or concerns that should arise related to todays visit.    Counseling:    No orders of the defined types were placed in this encounter.   No orders of the defined types were placed in this encounter.   Time spent:30 Minutes "

## 2024-11-22 ENCOUNTER — Ambulatory Visit

## 2024-11-26 ENCOUNTER — Ambulatory Visit: Admission: RE | Admit: 2024-11-26 | Source: Ambulatory Visit

## 2024-11-27 ENCOUNTER — Ambulatory Visit: Admission: RE | Admit: 2024-11-27 | Discharge: 2024-11-27 | Attending: Physician Assistant

## 2024-11-27 DIAGNOSIS — R10A1 Flank pain, right side: Secondary | ICD-10-CM

## 2024-11-27 DIAGNOSIS — R350 Frequency of micturition: Secondary | ICD-10-CM

## 2024-11-27 DIAGNOSIS — M549 Dorsalgia, unspecified: Secondary | ICD-10-CM

## 2024-11-27 DIAGNOSIS — S20221A Contusion of right back wall of thorax, initial encounter: Secondary | ICD-10-CM

## 2024-11-29 ENCOUNTER — Encounter: Payer: Self-pay | Admitting: Physician Assistant

## 2024-11-29 ENCOUNTER — Ambulatory Visit: Admitting: Physician Assistant

## 2024-11-29 VITALS — BP 108/60 | HR 92 | Temp 98.0°F | Resp 16 | Ht 67.0 in | Wt 211.0 lb

## 2024-11-29 DIAGNOSIS — M549 Dorsalgia, unspecified: Secondary | ICD-10-CM | POA: Diagnosis not present

## 2024-11-29 DIAGNOSIS — E669 Obesity, unspecified: Secondary | ICD-10-CM

## 2024-11-29 DIAGNOSIS — Z794 Long term (current) use of insulin: Secondary | ICD-10-CM | POA: Diagnosis not present

## 2024-11-29 DIAGNOSIS — E1165 Type 2 diabetes mellitus with hyperglycemia: Secondary | ICD-10-CM

## 2024-11-29 DIAGNOSIS — I10 Essential (primary) hypertension: Secondary | ICD-10-CM | POA: Diagnosis not present

## 2024-11-29 DIAGNOSIS — L819 Disorder of pigmentation, unspecified: Secondary | ICD-10-CM | POA: Diagnosis not present

## 2024-11-29 DIAGNOSIS — K8020 Calculus of gallbladder without cholecystitis without obstruction: Secondary | ICD-10-CM

## 2024-11-29 NOTE — Progress Notes (Cosign Needed)
 [facility] [address] [place], [place] [address]  Internal MEDICINE  Office Visit Note  Patient Name: Vicki Simmons  [id number]  [id number]  Date of Service: 11/29/2024  Chief Complaint  Patient presents with   Follow-up   Diabetes   Hypertension    HPI Pt is here for routine follow up -CT reviewed:  IMPRESSION:  1. No acute inflammatory process identified within the abdomen or  pelvis.  2. No nephroureterolithiasis or obstructive uropathy.  3. Single sub 5 mm calcified gallstone without imaging signs of  acute cholecystitis.  Aortic Atherosclerosis  -No explanation for low back bruising/ knot. Mild multilevel degeneration seen. Bruising is now gone. Does still have some mottling appearance to the skin along lower half of back that may be from chronic heating pad use--she has since stopped. 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 Methocarbamol helps but only taking prn. -MRI done yesterday for spine and sees spine specialist on Monday to review -doing well with ozempic , down 8lbs since last 2 weeks. BG dropping and had some lows so she has been reducing insulin . Actually skipped insulin  a few days, but did have to take some again yesterday, just less than normal -will hold midday hydralazine  since BP improved  Current Medication: Outpatient Encounter Medications as of 11/29/2024  Medication Sig   glimepiride  (AMARYL ) 4 MG tablet Take 1 tablet (4 mg total) by mouth 2 (two) times daily.   hydrALAZINE  (APRESOLINE ) 10 MG tablet TAKE 1 TABLET BY MOUTH ONCE DAILY AS NEEDED   hydrALAZINE  (APRESOLINE ) 25 MG tablet Take 1 tablet by mouth twice daily   hydrochlorothiazide  (HYDRODIURIL ) 25 MG tablet Take 1 tablet by mouth once daily   insulin  NPH Human (NOVOLIN N) 100 UNIT/ML injection Inject 60units [place] QAM and 70 units [place] QPM   lamoTRIgine (LAMICTAL) 25 MG tablet Take 100 mg by mouth 2 (two) times daily.   lisinopril   (ZESTRIL ) 40 MG tablet Take 1 tablet by mouth once daily   metFORMIN  (GLUCOPHAGE ) 1000 MG tablet Take 1 tablet (1,000 mg total) by mouth 2 (two) times daily with a meal.   potassium chloride  (KLOR-CON ) 10 MEQ tablet Take 1 tablet (10 mEq total) by mouth daily.   rosuvastatin  (CRESTOR ) 5 MG tablet Take 1 tablet (5 mg total) by mouth daily.   Semaglutide ,0.25 or 0.5MG /DOS, (OZEMPIC , 0.25 OR 0.5 MG/DOSE,) 2 MG/3ML SOPN Inject 0.5 mg into the skin once a week.   No facility-administered encounter medications on file as of 11/29/2024.    Surgical History: Past Surgical History:  Procedure Laterality Date   CESAREAN SECTION     TUBAL LIGATION      Medical History: Past Medical History:  Diagnosis Date   Diabetes (HCC)    Hypertension     Family History: Family History  Problem Relation Age of Onset   Diabetes Mother    Hyperlipidemia Mother    Hypertension Mother    Heart attack Father    Stroke Maternal Grandmother    Breast cancer Neg Hx     Social History   Socioeconomic History   Marital status: Married    Spouse name: Not on file   Number of children: Not on file   Years of education: Not on file   Highest education level: Not on file  Occupational History   Not on file  Tobacco Use   Smoking status: Former    Current packs/day: 0.00    Average  packs/day: 1.0 packs/day    Types: Cigarettes    Quit date: 09/11/2021    Years since quitting: 3.2   Smokeless tobacco: Never   Tobacco comments:    Quit in Nov.   Vaping Use   Vaping status: Every Day  Substance and Sexual Activity   Alcohol use: No   Drug use: No   Sexual activity: Not on file  Other Topics Concern   Not on file  Social History Narrative   Not on file   Social Drivers of Health   Tobacco Use: Medium Risk (11/29/2024)   Patient History    Smoking Tobacco Use: Former    Smokeless Tobacco Use: Never    Passive Exposure: Not on Actuary Strain: Not on file  Food Insecurity: Not  on file  Transportation Needs: Not on file  Physical Activity: Not on file  Stress: Not on file  Social Connections: Not on file  Intimate Partner Violence: Not on file  Depression (PHQ2-9): Low Risk (10/04/2024)   Depression (PHQ2-9)    PHQ-2 Score: 0  Alcohol Screen: Low Risk (12/22/2023)   Alcohol Screen    Last Alcohol Screening Score (AUDIT): 2  Housing: Unknown (11/14/2023)   Received from [facility]   Epic    Unable to Pay for Housing in the Last Year: Not on file    Number of Times Moved in the Last Year: Not on file    At any time in the past 12 months, were you homeless or living in a shelter (including now)?: No  Utilities: Not on file  Health Literacy: Not on file      Review of Systems  Constitutional:  Negative for chills and unexpected weight change.  HENT:  Negative for congestion, postnasal drip, rhinorrhea, sneezing and sore throat.   Eyes:  Negative for redness.  Respiratory:  Negative for cough, chest tightness and shortness of breath.   Cardiovascular:  Negative for chest pain and palpitations.  Gastrointestinal:  Negative for abdominal pain, constipation, diarrhea, nausea and vomiting.  Genitourinary:  Negative for dysuria and frequency.  Musculoskeletal:  Positive for back pain and myalgias. Negative for joint swelling and neck pain.  Skin:  Positive for color change.  Neurological:  Negative for tremors and numbness.  Hematological:  Negative for adenopathy. Does not bruise/bleed easily.  Psychiatric/Behavioral:  Negative for behavioral problems (Depression) and suicidal ideas.     Vital Signs: BP 108/60   Pulse 92   Temp 98 F (36.7 C)   Resp 16   Ht 5' 7 (1.702 m)   Wt 211 lb (95.7 kg)   LMP 11/07/2024 (Exact Date)   SpO2 98%   BMI 33.05 kg/m    Physical Exam Vitals and nursing note reviewed.  Constitutional:      General: She is not in acute distress.    Appearance: Normal appearance. She is obese. She is not  ill-appearing.  HENT:     Head: Normocephalic and atraumatic.  Cardiovascular:     Rate and Rhythm: Normal rate and regular rhythm.  Pulmonary:     Effort: Pulmonary effort is normal. No respiratory distress.  Musculoskeletal:        General: Normal range of motion.     Comments: Nontender along low back, but small pea-size knot still felt over right low back. No bruising now, but mottling of lower half of back present  Skin:    General: Skin is warm and dry.  Findings: No bruising.  Neurological:     Mental Status: She is alert and oriented to person, place, and time.  Psychiatric:        Mood and Affect: Mood normal.        Behavior: Behavior normal.        Assessment/Plan: 1. Acute right-sided back pain, unspecified back location (Primary) Continue methocarbamol prn, advised to avoid heaving lifting/pulling at work. Follow up with ortho/spine specialist Monday as planned  2. Type 2 diabetes mellitus with hyperglycemia, with long-term current use of insulin  (HCC) Doing well on ozempic  and may continue to reduce insulin  based on BG. May be able to reduce glimepiride  in future as well. Plan for A1c next visit  3. Essential hypertension, benign Well controlled, may d/c midday 10mg  hydralazine  for now and monitor  4. Mottled skin Light mottling pattern along lower half of back likely due to chronic heating pad use. Will d/c heating pad and monitor. Denies any mottling elsewhere on body  5. Calculus of gallbladder without cholecystitis without obstruction Asymptomatic seen on CT  6. Obesity (BMI 30-39.9) Down 8lbs in 2 weeks, will continue ozempic  for BG control and wt loss benefits   General Counseling: [place] understanding of the findings of todays visit and agrees with plan of treatment. I have discussed any further diagnostic evaluation that may be needed or ordered today. We also reviewed her medications today. she has been encouraged to call the office with  any questions or concerns that should arise related to todays visit.    No orders of the defined types were placed in this encounter.   No orders of the defined types were placed in this encounter.   This patient was seen by Tinnie Pro, PA-C in collaboration with Dr. Sigrid Bathe as a part of collaborative care agreement.   Total time spent:30 Minutes Time spent includes review of chart, medications, test results, and follow up plan with the patient.      Dr Fozia M Khan Internal medicine

## 2025-01-10 ENCOUNTER — Ambulatory Visit: Admitting: Physician Assistant

## 2025-10-07 ENCOUNTER — Encounter: Admitting: Physician Assistant
